# Patient Record
Sex: Female | Born: 1945 | Race: Black or African American | Hispanic: No | Smoking: Never smoker
Health system: Southern US, Community
[De-identification: ages and names within clinical notes are randomized; demographics above are authoritative.]

## PROBLEM LIST (undated history)

## (undated) DIAGNOSIS — E785 Hyperlipidemia, unspecified: Secondary | ICD-10-CM

## (undated) DIAGNOSIS — E119 Type 2 diabetes mellitus without complications: Secondary | ICD-10-CM

## (undated) DIAGNOSIS — N289 Disorder of kidney and ureter, unspecified: Secondary | ICD-10-CM

## (undated) DIAGNOSIS — I10 Essential (primary) hypertension: Secondary | ICD-10-CM

## (undated) HISTORY — PX: ABDOMINAL HYSTERECTOMY: SHX81

## (undated) HISTORY — DX: Hyperlipidemia, unspecified: E78.5

---

## 2000-05-18 HISTORY — PX: BREAST BIOPSY: SHX20

## 2018-11-08 ENCOUNTER — Other Ambulatory Visit: Payer: Self-pay | Admitting: Geriatric Medicine

## 2018-11-08 DIAGNOSIS — Z1231 Encounter for screening mammogram for malignant neoplasm of breast: Secondary | ICD-10-CM

## 2018-12-23 ENCOUNTER — Ambulatory Visit: Payer: Self-pay

## 2019-02-20 ENCOUNTER — Other Ambulatory Visit: Payer: Self-pay

## 2019-02-20 ENCOUNTER — Ambulatory Visit
Admission: RE | Admit: 2019-02-20 | Discharge: 2019-02-20 | Disposition: A | Discharge status: Home / Self Care | Payer: Medicare Other | Source: Ambulatory Visit | Attending: Geriatric Medicine | Admitting: Geriatric Medicine

## 2019-02-20 ENCOUNTER — Ambulatory Visit: Payer: Self-pay

## 2019-02-20 DIAGNOSIS — Z1231 Encounter for screening mammogram for malignant neoplasm of breast: Secondary | ICD-10-CM

## 2019-04-07 ENCOUNTER — Ambulatory Visit
Admission: RE | Admit: 2019-04-07 | Discharge: 2019-04-07 | Disposition: A | Discharge status: Home / Self Care | Payer: Medicare Other | Source: Ambulatory Visit | Attending: Geriatric Medicine | Admitting: Geriatric Medicine

## 2019-04-07 ENCOUNTER — Other Ambulatory Visit: Payer: Self-pay | Admitting: Geriatric Medicine

## 2019-04-07 DIAGNOSIS — R05 Cough: Secondary | ICD-10-CM

## 2019-04-07 DIAGNOSIS — R059 Cough, unspecified: Secondary | ICD-10-CM

## 2020-08-08 ENCOUNTER — Emergency Department (HOSPITAL_COMMUNITY): Payer: Medicare Other

## 2020-08-08 ENCOUNTER — Encounter (HOSPITAL_COMMUNITY): Payer: Self-pay | Admitting: Emergency Medicine

## 2020-08-08 ENCOUNTER — Emergency Department (HOSPITAL_COMMUNITY)
Admission: EM | Admit: 2020-08-08 | Discharge: 2020-08-09 | Disposition: A | Discharge status: Home / Self Care | Payer: Medicare Other | Attending: Emergency Medicine | Admitting: Emergency Medicine

## 2020-08-08 DIAGNOSIS — R109 Unspecified abdominal pain: Secondary | ICD-10-CM | POA: Insufficient documentation

## 2020-08-08 DIAGNOSIS — E119 Type 2 diabetes mellitus without complications: Secondary | ICD-10-CM | POA: Diagnosis not present

## 2020-08-08 DIAGNOSIS — R072 Precordial pain: Secondary | ICD-10-CM

## 2020-08-08 DIAGNOSIS — I10 Essential (primary) hypertension: Secondary | ICD-10-CM | POA: Diagnosis not present

## 2020-08-08 DIAGNOSIS — K219 Gastro-esophageal reflux disease without esophagitis: Secondary | ICD-10-CM | POA: Diagnosis not present

## 2020-08-08 DIAGNOSIS — H6123 Impacted cerumen, bilateral: Secondary | ICD-10-CM | POA: Insufficient documentation

## 2020-08-08 DIAGNOSIS — R059 Cough, unspecified: Secondary | ICD-10-CM | POA: Insufficient documentation

## 2020-08-08 HISTORY — DX: Disorder of kidney and ureter, unspecified: N28.9

## 2020-08-08 HISTORY — DX: Essential (primary) hypertension: I10

## 2020-08-08 HISTORY — DX: Type 2 diabetes mellitus without complications: E11.9

## 2020-08-08 LAB — CBC
HCT: 37.4 % (ref 36.0–46.0)
Hemoglobin: 12.6 g/dL (ref 12.0–15.0)
MCH: 27.1 pg (ref 26.0–34.0)
MCHC: 33.7 g/dL (ref 30.0–36.0)
MCV: 80.4 fL (ref 80.0–100.0)
Platelets: 206 10*3/uL (ref 150–400)
RBC: 4.65 MIL/uL (ref 3.87–5.11)
RDW: 15.5 % (ref 11.5–15.5)
WBC: 13.6 10*3/uL — ABNORMAL HIGH (ref 4.0–10.5)
nRBC: 0 % (ref 0.0–0.2)

## 2020-08-08 LAB — HEPATIC FUNCTION PANEL
ALT: 21 U/L (ref 0–44)
AST: 24 U/L (ref 15–41)
Albumin: 4.3 g/dL (ref 3.5–5.0)
Alkaline Phosphatase: 88 U/L (ref 38–126)
Bilirubin, Direct: <0.1 mg/dL (ref 0.0–0.2)
Total Bilirubin: 0.6 mg/dL (ref 0.3–1.2)
Total Protein: 7.9 g/dL (ref 6.5–8.1)

## 2020-08-08 LAB — TROPONIN I (HIGH SENSITIVITY)
Troponin I (High Sensitivity): 5 ng/L (ref <18)
Troponin I (High Sensitivity): 4 ng/L (ref <18)

## 2020-08-08 LAB — BASIC METABOLIC PANEL
Anion gap: 10 (ref 5–15)
BUN: 19 mg/dL (ref 8–23)
CO2: 23 mmol/L (ref 22–32)
Calcium: 10.3 mg/dL (ref 8.9–10.3)
Chloride: 105 mmol/L (ref 98–111)
Creatinine, Ser: 1.27 mg/dL — ABNORMAL HIGH (ref 0.44–1.00)
GFR, Estimated: 45 mL/min — ABNORMAL LOW (ref >60)
Glucose, Bld: 148 mg/dL — ABNORMAL HIGH (ref 70–99)
Potassium: 4.3 mmol/L (ref 3.5–5.1)
Sodium: 138 mmol/L (ref 135–145)

## 2020-08-08 MED ORDER — FAMOTIDINE 20 MG PO TABS
20.0000 mg | ORAL_TABLET | Freq: Once | ORAL | Status: AC
  Administered 2020-08-08: 20 mg via ORAL
  Filled 2020-08-08: qty 1

## 2020-08-08 MED ORDER — ALUM & MAG HYDROXIDE-SIMETH 200-200-20 MG/5ML PO SUSP
30.0000 mL | Freq: Once | ORAL | Status: AC
  Administered 2020-08-08: 30 mL via ORAL
  Filled 2020-08-08: qty 30

## 2020-08-08 MED ORDER — ACETAMINOPHEN 500 MG PO TABS
1000.0000 mg | ORAL_TABLET | Freq: Once | ORAL | Status: AC
  Administered 2020-08-08: 1000 mg via ORAL
  Filled 2020-08-08: qty 2

## 2020-08-08 NOTE — ED Provider Notes (Signed)
[facility] Provider Note   CSN: [id number] Arrival date & time: 08/08/20  1741     History Chief Complaint  Patient presents with  . Chest Pain    Lauren Spears is a 75 y.o. female.  Patient c/o mid chest discomfort at rest earlier today. Was at rest, states initially dull pain, midline, lower sternal area, non radiating, and then states noted feeling squishy, bubbly/gas, sensation in chest, felt warm, had non productive cough as if needed to clear throat. Denies any exertional chest pain or discomfort. No pleuritic chest pain. No vomiting. Denies diaphoresis. Denies sob or unusual doe. No fatigue. Also notes ear fullness in past few days, and occasional notices sense of fluid or swooshing in ears. No hearing loss or ringing. No drainage from ears. No severe ear pain or headache. States has tried to use peroxide/water to get wax out of ears. No vertigo or room spinning. No problems w balance or coordination, no falls. Denies hx cad. Denies fam hx premature cad. States a sister had unspecific heart problems.   The history is provided by the patient.  Chest Pain Associated symptoms: no abdominal pain, no back pain, no cough, no diaphoresis, no fever, no headache, no palpitations, no shortness of breath and no vomiting        Past Medical History:  Diagnosis Date  . Diabetes mellitus without complication ([place])   . Hypertension   . Renal disorder     There are no problems to display for this patient.   History reviewed. No pertinent surgical history.   OB History   No obstetric history on file.     History reviewed. No pertinent family history.     Home Medications Prior to Admission medications   Medication Sig Start Date End Date Taking? Authorizing Provider  amLODipine (NORVASC) 5 MG tablet Take 5 mg by mouth daily. 05/16/20  Yes [provider]  atorvastatin (LIPITOR) 40 MG tablet Take 40 mg by mouth daily. 05/16/20  Yes  [provider]    Allergies    Patient has no known allergies.  Review of Systems   Review of Systems  Constitutional: Negative for chills, diaphoresis and fever.  HENT: Negative for sore throat.   Eyes: Negative for pain and visual disturbance.  Respiratory: Negative for cough and shortness of breath.   Cardiovascular: Positive for chest pain. Negative for palpitations and leg swelling.  Gastrointestinal: Negative for abdominal pain and vomiting.  Genitourinary: Negative for flank pain.  Musculoskeletal: Negative for back pain and neck pain.  Skin: Negative for rash.  Neurological: Negative for headaches.  Hematological: Does not bruise/bleed easily.  Psychiatric/Behavioral: Negative for confusion.    Physical Exam Updated Vital Signs BP (!) 145/73   Pulse (!) 59   Temp 97.8 F (36.6 C)   Resp 12   SpO2 99%   Physical Exam Vitals and nursing note reviewed.  Constitutional:      Appearance: Normal appearance. She is well-developed.  HENT:     Head: Atraumatic.     Ears:     Comments: Partial cerumen impaction bilateral ears. No EO or OM noted. No mastoid tenderness.     Nose: Nose normal.     Mouth/Throat:     Mouth: Mucous membranes are moist.  Eyes:     General: No scleral icterus.    Conjunctiva/sclera: Conjunctivae normal.  Neck:     Trachea: No tracheal deviation.  Cardiovascular:     Rate and Rhythm:  Normal rate and regular rhythm.     Pulses: Normal pulses.     Heart sounds: Normal heart sounds. No murmur heard. No friction rub. No gallop.   Pulmonary:     Effort: Pulmonary effort is normal. No respiratory distress.     Breath sounds: Normal breath sounds.  Chest:     Chest wall: No tenderness.  Abdominal:     General: Bowel sounds are normal. There is no distension.     Palpations: Abdomen is soft.     Tenderness: There is no abdominal tenderness.  Genitourinary:    Comments: No cva tenderness.  Musculoskeletal:        General: No  swelling or tenderness.     Cervical back: Normal range of motion and neck supple. No rigidity. No muscular tenderness.     Right lower leg: No edema.     Left lower leg: No edema.  Skin:    General: Skin is warm and dry.     Findings: No rash.  Neurological:     Mental Status: She is alert.     Comments: Alert, speech normal. Motor/sens grossly intact bil. Steady gait.   Psychiatric:        Mood and Affect: Mood normal.     ED Results / Procedures / Treatments   Labs (all labs ordered are listed, but only abnormal results are displayed) Results for orders placed or performed during the hospital encounter of 13/08/65  Basic metabolic panel  Result Value Ref Range   Sodium 138 135 - 145 mmol/L   Potassium 4.3 3.5 - 5.1 mmol/L   Chloride 105 98 - 111 mmol/L   CO2 23 22 - 32 mmol/L   Glucose, Bld 148 (H) 70 - 99 mg/dL   BUN 19 8 - 23 mg/dL   Creatinine, Ser 1.27 (H) 0.44 - 1.00 mg/dL   Calcium 10.3 8.9 - 10.3 mg/dL   GFR, Estimated 45 (L) >60 mL/min   Anion gap 10 5 - 15  CBC  Result Value Ref Range   WBC 13.6 (H) 4.0 - 10.5 K/uL   RBC 4.65 3.87 - 5.11 MIL/uL   Hemoglobin 12.6 12.0 - 15.0 g/dL   HCT 37.4 36.0 - 46.0 %   MCV 80.4 80.0 - 100.0 fL   MCH 27.1 26.0 - 34.0 pg   MCHC 33.7 30.0 - 36.0 g/dL   RDW 15.5 11.5 - 15.5 %   Platelets 206 150 - 400 K/uL   nRBC 0.0 0.0 - 0.2 %  Hepatic function panel  Result Value Ref Range   Total Protein 7.9 6.5 - 8.1 g/dL   Albumin 4.3 3.5 - 5.0 g/dL   AST 24 15 - 41 U/L   ALT 21 0 - 44 U/L   Alkaline Phosphatase 88 38 - 126 U/L   Total Bilirubin 0.6 0.3 - 1.2 mg/dL   Bilirubin, Direct <0.1 0.0 - 0.2 mg/dL   Indirect Bilirubin NOT CALCULATED 0.3 - 0.9 mg/dL  Troponin I (High Sensitivity)  Result Value Ref Range   Troponin I (High Sensitivity) 5 <18 ng/L  Troponin I (High Sensitivity)  Result Value Ref Range   Troponin I (High Sensitivity) 4 <18 ng/L   DG Chest 2 View  Result Date: 08/08/2020 CLINICAL DATA:  Chest pain  EXAM: CHEST - 2 VIEW COMPARISON:  04/07/2019 FINDINGS: The heart size and mediastinal contours are within normal limits. Both lungs are clear. The visualized skeletal structures are unremarkable. IMPRESSION: No active cardiopulmonary disease. Electronically Signed  By: Rolm Baptise M.D.   On: 08/08/2020 18:30    EKG EKG Interpretation  Date/Time:  Thursday August 08 2020 17:49:54 EDT Ventricular Rate:  95 PR Interval:  154 QRS Duration: 112 QT Interval:  394 QTC Calculation: 495 R Axis:   -120 Text Interpretation: Normal sinus rhythm Low voltage QRS Right bundle branch block Non-specific ST-t changes No previous tracing Confirmed by Lajean Saver [phone]) on 08/08/2020 8:24:46 PM   Radiology DG Chest 2 View  Result Date: 08/08/2020 CLINICAL DATA:  Chest pain EXAM: CHEST - 2 VIEW COMPARISON:  04/07/2019 FINDINGS: The heart size and mediastinal contours are within normal limits. Both lungs are clear. The visualized skeletal structures are unremarkable. IMPRESSION: No active cardiopulmonary disease. Electronically Signed   By: Rolm Baptise M.D.   On: 08/08/2020 18:30    Procedures Procedures   Medications Ordered in ED Medications  alum & mag hydroxide-simeth (MAALOX/MYLANTA) 200-200-20 MG/5ML suspension 30 mL (30 mLs Oral Given 08/08/20 2208)  famotidine (PEPCID) tablet 20 mg (20 mg Oral Given 08/08/20 2206)  acetaminophen (TYLENOL) tablet 1,000 mg (1,000 mg Oral Given 08/08/20 2206)    ED Course  I have reviewed the triage vital signs and the nursing notes.  Pertinent labs & imaging results that were available during my care of the patient were reviewed by me and considered in my medical decision making (see chart for details).    MDM Rules/Calculators/A&P                         Iv ns. Continuous pulse ox and cardiac monitoring. Stat labs. Imaging. Ecg.   Reviewed nursing notes and prior charts for additional history.   CXR reviewed/interpreted by me - no pna.   Labs  reviewed/interpreted by me - initial trop normal.   Pepcid, maalox and acetaminophen for symptom relief.   Recheck, family/daughter now here, notes recent c/o right flank area pain. States went to urgent care and was told possible pna, and given abx rx. In ED, no cough, no fever, no pna on imaging, chest cta. Abd is soft and non tender. ?recent ruq/flank pain. No abd mass, no puls mass. No cva tenderness. No dysuria or hematuria. Denies hx gallstones. No hx kidney stones.  Will add additional labs and imaging.   2315, signed out to Dr Betsey Holiday that u/s and UA are pending - if ok, anticipate d/c to home with outpatient f/u.  Return precautions provided.    Final Clinical Impression(s) / ED Diagnoses Final diagnoses:  Precordial chest pain  Gastroesophageal reflux disease, unspecified whether esophagitis present  Bilateral impacted cerumen  Right flank pain    Rx / DC Orders ED Discharge Orders    None       Lajean Saver, MD 08/08/20 2316

## 2020-08-08 NOTE — ED Triage Notes (Signed)
Patient complains of chest pain that started earlier today and resolved, reports feeling like something gripped around her heart. Patient denies pain at this time. Patient alert, oriented, and in no apparent distress at this time.

## 2020-08-08 NOTE — Discharge Instructions (Addendum)
It was our pleasure to provide your ER care today - we hope that you feel better.  If gi/reflux symptoms, take pepcid, gas-x, or maalox as need.   Rest.  Drink plenty of fluids, stay well hydrated.   Follow up with primary care doctor in the coming week.   For recent chest pain, follow up with cardiologist in the next 1-2 weeks - call office to arrange appointment.   For ear symptoms, see attached information, if symptoms persist, follow up with ENT doctor in the next couple weeks - call office to arrange appointment.   Return to ER if worse, new symptoms, fevers, new or severe pain, trouble breathing, recurrent or persistent chest pain, or other concern.

## 2020-08-09 NOTE — ED Provider Notes (Signed)
Patient signed out to me by Dr. Ashok Cordia to follow-up on ultrasound.  Patient seen earlier with chest and right-sided abdominal discomfort.  No pain currently but has been experiencing intermittent "gurgling" on the right side of the abdomen.  Cardiac evaluation is negative.  Symptoms are atypical for cardiac etiology.  Ultrasound has been reviewed.  She does have evidence of hepatic steatosis but no evidence of gallbladder disease or acute process.  Patient will therefore be discharged, follow-up with PCP.   Orpah Greek, MD 08/09/20 [phone]

## 2021-05-01 ENCOUNTER — Other Ambulatory Visit: Payer: Self-pay | Admitting: Internal Medicine

## 2021-05-01 ENCOUNTER — Ambulatory Visit
Admission: RE | Admit: 2021-05-01 | Discharge: 2021-05-01 | Disposition: A | Discharge status: Home / Self Care | Payer: Medicare Other | Source: Ambulatory Visit | Attending: Internal Medicine | Admitting: Internal Medicine

## 2021-05-01 DIAGNOSIS — R0609 Other forms of dyspnea: Secondary | ICD-10-CM

## 2021-05-26 ENCOUNTER — Other Ambulatory Visit: Payer: Self-pay

## 2021-05-26 ENCOUNTER — Ambulatory Visit: Payer: Medicare Other | Admitting: Cardiology

## 2021-05-26 ENCOUNTER — Encounter: Payer: Self-pay | Admitting: Cardiology

## 2021-05-26 VITALS — BP 117/64 | HR 88 | Temp 97.6°F | Resp 16 | Ht 65.0 in | Wt 168.2 lb

## 2021-05-26 DIAGNOSIS — I4510 Unspecified right bundle-branch block: Secondary | ICD-10-CM

## 2021-05-26 DIAGNOSIS — N183 Chronic kidney disease, stage 3 unspecified: Secondary | ICD-10-CM

## 2021-05-26 DIAGNOSIS — R072 Precordial pain: Secondary | ICD-10-CM

## 2021-05-26 DIAGNOSIS — R0602 Shortness of breath: Secondary | ICD-10-CM

## 2021-05-26 DIAGNOSIS — E119 Type 2 diabetes mellitus without complications: Secondary | ICD-10-CM

## 2021-05-26 DIAGNOSIS — E782 Mixed hyperlipidemia: Secondary | ICD-10-CM

## 2021-05-26 DIAGNOSIS — I129 Hypertensive chronic kidney disease with stage 1 through stage 4 chronic kidney disease, or unspecified chronic kidney disease: Secondary | ICD-10-CM

## 2021-05-26 NOTE — Progress Notes (Signed)
Date:  05/26/2021   ID:  Lauren Spears, DOB [birth year], MRN [id number]  PCP:  Lauren Ferretti, MD  Cardiologist:  Lauren Kras, DO, [facility] (established care 05/26/2021)  REASON FOR CONSULT: Exertional dyspnea  REQUESTING PHYSICIAN:  Lauren Manes, MD [address] [place],  [place] [address]  Chief Complaint  Patient presents with   Shortness of Breath   New Patient (Initial Visit)   Chest Pain   Fatigue    HPI  Lauren Spears is a 76 y.o. African-American female who presents to the office with a chief complaint of " shortness of breath, chest pain, fatigue." Patient's past medical history and cardiovascular risk factors include: HTN, HLD, diet-controlled type 2 diabetes mellitus, postmenopausal female.  She is referred to the office at the request of Lauren Manes, MD for evaluation of exertional dyspnea.  Patient is accompanied by her daughter Lauren Spears at today's office visit.  Patient provides verbal consent in regards to discussing her medications/medical conditions in her presence.  She had a flu vaccination as well as COVID immunization in December 2022 and since then has been feeling tired, fatigued, chest discomfort and shortness of breath.  She just feels exhausted and therefore had gone to see her PCP.  She was noted to have an abnormal EKG and therefore referred to cardiology.  Chest pain: Has been present for the last several months, nonprogressive, last episode December 2022, located substernally, intensity 6 out of 10, describes it as a discomfort like sensation, lasting for few seconds, worse with effort related activities such as house chores at times, improves with resting.  No recent hospitalizations or urgent care visits for these symptoms.  Associated symptoms include exertional dyspnea.  However denies orthopnea, paroxysmal nocturnal dyspnea lower extremity swelling.  Patient does have sublingual nitroglycerin tablets but has not required it.  Younger  brother has a diagnosis of hypertrophic cardiomyopathy.  Patient denies any such diagnoses.  No prior history of syncope.  FUNCTIONAL STATUS: Does her daily house chores at the age of 65.  However no structured exercise program or daily routine.  ALLERGIES: No Known Allergies  MEDICATION LIST PRIOR TO VISIT: Current Meds  Medication Sig   amLODipine (NORVASC) 5 MG tablet Take 5 mg by mouth daily.   aspirin EC 81 MG tablet Take 81 mg by mouth daily. Swallow whole.   atorvastatin (LIPITOR) 40 MG tablet Take 40 mg by mouth daily.   cholecalciferol (VITAMIN D3) 25 MCG (1000 UNIT) tablet 1 tablet   nitroGLYCERIN (NITROSTAT) 0.4 MG SL tablet Place 0.4 mg under the tongue as directed.     PAST MEDICAL HISTORY: Past Medical History:  Diagnosis Date   Diabetes mellitus without complication ([place])    Hyperlipidemia    Hypertension    Renal disorder     PAST SURGICAL HISTORY: Past Surgical History:  Procedure Laterality Date   ABDOMINAL HYSTERECTOMY      FAMILY HISTORY: The patient family history includes Heart disease in her brother and sister.  SOCIAL HISTORY:  The patient  reports that she has never smoked. She has never used smokeless tobacco. She reports that she does not drink alcohol and does not use drugs.  REVIEW OF SYSTEMS: Review of Systems  Constitutional: Positive for malaise/fatigue. Negative for chills and fever.  HENT:  Negative for hoarse voice and nosebleeds.   Eyes:  Negative for discharge, double vision and pain.  Cardiovascular:  Positive for chest pain and dyspnea on exertion. Negative for claudication, leg swelling, near-syncope,  orthopnea, palpitations, paroxysmal nocturnal dyspnea and syncope.  Respiratory:  Positive for shortness of breath. Negative for hemoptysis.   Musculoskeletal:  Negative for muscle cramps and myalgias.  Gastrointestinal:  Negative for abdominal pain, constipation, diarrhea, hematemesis, hematochezia, melena, nausea and vomiting.   Neurological:  Negative for dizziness and light-headedness.   PHYSICAL EXAM: Vitals with BMI 05/26/2021 08/09/2020 08/09/2020  Height 5\' 5"  - -  Weight 168 lbs 3 oz - -  BMI 16.10 - -  Systolic 960 - 454  Diastolic 64 - 74  Pulse 88 69 58    CONSTITUTIONAL: Well-developed and well-nourished. No acute distress.  SKIN: Skin is warm and dry. No rash noted. No cyanosis. No pallor. No jaundice HEAD: Normocephalic and atraumatic.  EYES: No scleral icterus MOUTH/THROAT: Moist oral membranes.  NECK: No JVD present. No thyromegaly noted. No carotid bruits  LYMPHATIC: No visible cervical adenopathy.  CHEST Normal respiratory effort. No intercostal retractions  LUNGS: Clear to auscultation bilaterally.  No stridor. No wheezes. No rales.  CARDIOVASCULAR: Regular rate and rhythm, positive S1-S2, no murmurs rubs or gallops appreciated. ABDOMINAL: Soft, nontender, nondistended, positive bowel sounds in all 4 quadrants, no apparent ascites.  EXTREMITIES: No peripheral edema, warm to touch, 2+ DP bilateral pulses HEMATOLOGIC: No significant bruising NEUROLOGIC: Oriented to person, place, and time. Nonfocal. Normal muscle tone.  PSYCHIATRIC: Normal mood and affect. Normal behavior. Cooperative  CARDIAC DATABASE: EKG: 05/26/2021: Normal sinus rhythm, 80 bpm, right bundle branch block, right axis, poor R wave progression.   Echocardiogram: No results found for this or any previous visit from the past 1095 days.    Stress Testing: No results found for this or any previous visit from the past 1095 days.   Heart Catheterization: None  LABORATORY DATA: CBC Latest Ref Rng & Units 08/08/2020  WBC 4.0 - 10.5 K/uL 13.6(H)  Hemoglobin 12.0 - 15.0 g/dL 12.6  Hematocrit 36.0 - 46.0 % 37.4  Platelets 150 - 400 K/uL 206    CMP Latest Ref Rng & Units 08/08/2020  Glucose 70 - 99 mg/dL 148(H)  BUN 8 - 23 mg/dL 19  Creatinine 0.44 - 1.00 mg/dL 1.27(H)  Sodium 135 - 145 mmol/L 138  Potassium 3.5 - 5.1  mmol/L 4.3  Chloride 98 - 111 mmol/L 105  CO2 22 - 32 mmol/L 23  Calcium 8.9 - 10.3 mg/dL 10.3  Total Protein 6.5 - 8.1 g/dL 7.9  Total Bilirubin 0.3 - 1.2 mg/dL 0.6  Alkaline Phos 38 - 126 U/L 88  AST 15 - 41 U/L 24  ALT 0 - 44 U/L 21    Lipid Panel  No results found for: CHOL, TRIG, HDL, CHOLHDL, VLDL, LDLCALC, LDLDIRECT, LABVLDL  No components found for: NTPROBNP No results for input(s): PROBNP in the last 8760 hours. No results for input(s): TSH in the last 8760 hours.  BMP Recent Labs    08/08/20 1751  NA 138  K 4.3  CL 105  CO2 23  GLUCOSE 148*  BUN 19  CREATININE 1.27*  CALCIUM 10.3  GFRNONAA 45*    HEMOGLOBIN A1C No results found for: HGBA1C, MPG  External Labs: Available in Care Everywhere 05/01/2021  Name Result Date Reference Range  CBC without Diff   2021-05-01    WBC 6.9   4.0-11.0  RBC 4.72   4.20-5.40  HGB 12.3   12.0-16.0  HCT 37.6   37.0-47.0  MCV 79.7   81.0-99.0  MCH 26.1   27.0-33.0  MCHC 32.8   32.0-36.0  RDW 14.8  11.5-15.5  PLT 221   150-400  Culture if Positive (Urinalysis)   2021-05-01    CIFP See Culture Report from [facility]      Comp Metabolic Panel   2706-23-76    Glucose 105   70-99  BUN 19   6-26  Creatinine 1.38   0.60-1.30  eGFR2021 40   >60  Sodium 140   136-145  Potassium 4.1   3.5-5.5  Chloride 105   98-107  CO2 26   22-32  Anion Gap 12.9   6.0-20.0  Calcium 10.4   8.6-10.3  CA-corrected 9.95   8.60-10.30  Protein, Total 7.1   6.0-8.3  Albumin 4.4   3.4-4.8  TBIL 0.9   0.3-1.0  ALP 94   38-126  AST 16   0-39  ALT 11   0-52   Lipid Panel w/reflex   2020-05-31    Cholesterol 146   <200  CHOL/HDL 3.0   2.0-4.0  HDLD 48   30-85  Triglyceride 92   0-199  NHDL 98   0-129  LDL Chol Calc (NIH) 81   0-99  LDL Chol Calc (NIH) 81   0-99   IMPRESSION:    ICD-10-CM   1. Shortness of breath  R06.02 EKG 12-Lead    PCV ECHOCARDIOGRAM COMPLETE    PCV MYOCARDIAL PERFUSION WO LEXISCAN    2. Precordial pain  R07.2  PCV ECHOCARDIOGRAM COMPLETE    PCV MYOCARDIAL PERFUSION WO LEXISCAN    CT CARDIAC SCORING ([facility] LOCATIONS ONLY)    3. Right bundle branch block  I45.10 PCV MYOCARDIAL PERFUSION WO LEXISCAN    4. Mixed hyperlipidemia  E78.2     5. Benign hypertension with CKD (chronic kidney disease) stage III (H[facility])  I12.9    N18.30     6. Diet-controlled diabetes mellitus ([place])  E11.9        RECOMMENDATIONS: Kaysa Roulhac is a 76 y.o. African-American female whose past medical history and cardiac risk factors include: HTN, HLD, diet-controlled type 2 diabetes mellitus, postmenopausal female.  Shortness of breath/precordial pain: Patient's chest pain appears to be having both cardiac and noncardiac features. Last episode December 2022 No recent hospitalizations or urgent care visits for cardiovascular symptoms/evaluation Patient has been prescribed sublingual nitroglycerin tablet by PCP and has not required the use of it. EKG: Sinus rhythm without underlying injury pattern Given her symptoms, risk factors, and underlying right bundle branch block recommend exercise nuclear stress test to evaluate for functional status, reversible ischemia. Echocardiogram will be ordered to evaluate for structural heart disease and left ventricular systolic function. Outside labs independently reviewed as part of today's encounter from care everywhere noted above for further reference. Educated on seeking medical attention sooner by going to the closest ER via EMS if the symptoms increase in intensity, frequency, duration, or has typical chest pain as discussed in the office.  Patient verbalized understanding.  Right bundle branch block Monitor for now.  Mixed hyperlipidemia Currently on Lipitor.   She denies myalgia or other side effects. Most recent lipids dated 05/31/2020 reviewed as noted above. Currently managed by primary care provider.   Benign hypertension with CKD (chronic kidney disease) stage III  ([place]) Office blood pressure is at goal.  Medication reconciled.  Low salt diet recommended. A diet that is rich in fruits, vegetables, legumes, and low-fat dairy products and low in snacks, sweets, and meats (such as the Dietary Approaches to Stop Hypertension [DASH] diet).   Currently managed by primary care provider.  Diet-controlled diabetes  mellitus ([place]) Currently on statin therapy. Most recent hemoglobin A1c well controlled. May consider transitioning her from amlodipine to either ACE inhibitor/ARB given her history of diabetes.  We will defer to PCP  FINAL MEDICATION LIST END OF ENCOUNTER: No orders of the defined types were placed in this encounter.   There are no discontinued medications.   Current Outpatient Medications:    amLODipine (NORVASC) 5 MG tablet, Take 5 mg by mouth daily., Disp: , Rfl:    aspirin EC 81 MG tablet, Take 81 mg by mouth daily. Swallow whole., Disp: , Rfl:    atorvastatin (LIPITOR) 40 MG tablet, Take 40 mg by mouth daily., Disp: , Rfl:    cholecalciferol (VITAMIN D3) 25 MCG (1000 UNIT) tablet, 1 tablet, Disp: , Rfl:    nitroGLYCERIN (NITROSTAT) 0.4 MG SL tablet, Place 0.4 mg under the tongue as directed., Disp: , Rfl:   Orders Placed This Encounter  Procedures   CT CARDIAC SCORING ([facility] LOCATIONS ONLY)   PCV MYOCARDIAL PERFUSION WO LEXISCAN   EKG 12-Lead   PCV ECHOCARDIOGRAM COMPLETE    There are no Patient Instructions on file for this visit.   --Continue cardiac medications as reconciled in final medication list. --Return in about 4 weeks (around 06/23/2021) for Follow up, Chest pain. Or sooner if needed. --Continue follow-up with your primary care physician regarding the management of your other chronic comorbid conditions.  Patient's questions and concerns were addressed to her satisfaction. She voices understanding of the instructions provided during this encounter.   This note was created using a voice recognition software as a result there  may be grammatical errors inadvertently enclosed that do not reflect the nature of this encounter. Every attempt is made to correct such errors.  Lauren Spears, [place]  Pager: [phone] Office: [phone]

## 2021-06-25 ENCOUNTER — Ambulatory Visit: Payer: Medicare Other

## 2021-06-25 ENCOUNTER — Other Ambulatory Visit: Payer: Self-pay

## 2021-06-25 DIAGNOSIS — R072 Precordial pain: Secondary | ICD-10-CM

## 2021-06-25 DIAGNOSIS — R0602 Shortness of breath: Secondary | ICD-10-CM

## 2021-06-25 DIAGNOSIS — I4510 Unspecified right bundle-branch block: Secondary | ICD-10-CM

## 2021-07-02 ENCOUNTER — Ambulatory Visit: Payer: Medicare Other | Admitting: Cardiology

## 2021-07-02 ENCOUNTER — Encounter: Payer: Self-pay | Admitting: Cardiology

## 2021-07-02 ENCOUNTER — Other Ambulatory Visit: Payer: Self-pay

## 2021-07-02 VITALS — BP 116/72 | HR 76 | Temp 97.3°F | Resp 17 | Ht 65.0 in | Wt 165.2 lb

## 2021-07-02 DIAGNOSIS — R072 Precordial pain: Secondary | ICD-10-CM

## 2021-07-02 DIAGNOSIS — E782 Mixed hyperlipidemia: Secondary | ICD-10-CM

## 2021-07-02 DIAGNOSIS — E119 Type 2 diabetes mellitus without complications: Secondary | ICD-10-CM

## 2021-07-02 DIAGNOSIS — R0602 Shortness of breath: Secondary | ICD-10-CM

## 2021-07-02 DIAGNOSIS — I4510 Unspecified right bundle-branch block: Secondary | ICD-10-CM

## 2021-07-02 DIAGNOSIS — N183 Chronic kidney disease, stage 3 unspecified: Secondary | ICD-10-CM

## 2021-07-02 DIAGNOSIS — I129 Hypertensive chronic kidney disease with stage 1 through stage 4 chronic kidney disease, or unspecified chronic kidney disease: Secondary | ICD-10-CM

## 2021-07-02 NOTE — Progress Notes (Signed)
Date:  07/02/2021   ID:  Lauren Spears, DOB 1945-05-25, MRN [id number]  PCP:  Lajean Manes, MD  Cardiologist:  Rex Kras, DO, [facility] (established care 05/26/2021)  Date: 07/02/21 Last Office Visit: 05/26/2021  Chief Complaint  Patient presents with   Shortness of Breath   Follow-up    4 WEEKS   Chest Pain    HPI  Lauren Spears is a 76 y.o. African-American female who presents to the office with a chief complaint of " reevaluation of chest pain, shortness of breath, and discuss test results." Patient's past medical history and cardiovascular risk factors include: HTN, HLD, diet-controlled type 2 diabetes mellitus, postmenopausal female.  She is referred to the office at the request of Charlane Ferretti, MD for evaluation of exertional dyspnea.  Patient is accompanied by her daughter Sharyn Lull at today's office visit.  Patient provides verbal consent in regards to discussing her medications/medical conditions in her presence.  December 2022 she had her flu and COVID-19 immunizations : Has been experiencing generalized tired/fatigue, chest discomfort and shortness of breath.  She was evaluated by PCP and later referred to cardiology for further evaluation and management.  Her precordial discomfort appear to be having both cardiac and noncardiac features and given her age and risk factors she was recommended to undergo echo and stress test.  Echocardiogram notes preserved LVEF, normal diastolic function, no significant valvular heart disease.  An exercise nuclear stress test notes no significant reversible ischemia with a small size fixed perfusion defect likely due to soft tissue attenuation artifact.  Since last office visit patient states that her chest pain and shortness of breath have improved but not resolved.  Patient states that the shortness of breath that leads to precordial discomfort, located substernally, occurs randomly, lasting less than 5 minutes, nonradiating, nonexertional, and not  improving with resting, self-limited.  She has not required sublingual nitroglycerin tablets.  Younger brother has a diagnosis of hypertrophic cardiomyopathy.  Patient denies any such diagnoses.  No prior history of syncope.  FUNCTIONAL STATUS: Does her daily house chores at the age of 76.  However no structured exercise program or daily routine.  ALLERGIES: No Known Allergies  MEDICATION LIST PRIOR TO VISIT: Current Meds  Medication Sig   amLODipine (NORVASC) 5 MG tablet Take 5 mg by mouth daily.   aspirin EC 81 MG tablet Take 81 mg by mouth daily. Swallow whole.   atorvastatin (LIPITOR) 40 MG tablet Take 40 mg by mouth daily.   cholecalciferol (VITAMIN D3) 25 MCG (1000 UNIT) tablet 1 tablet   nitroGLYCERIN (NITROSTAT) 0.4 MG SL tablet Place 0.4 mg under the tongue as directed.     PAST MEDICAL HISTORY: Past Medical History:  Diagnosis Date   Diabetes mellitus without complication ([place])    Hyperlipidemia    Hypertension    Renal disorder     PAST SURGICAL HISTORY: Past Surgical History:  Procedure Laterality Date   ABDOMINAL HYSTERECTOMY      FAMILY HISTORY: The patient family history includes Cancer (age of onset: 64) in her sister; Heart disease in her brother and sister; Leukemia (age of onset: 63) in her mother; Prostate cancer (age of onset: 38) in her brother and brother; Sickle cell anemia (age of onset: 77) in her father.  SOCIAL HISTORY:  The patient  reports that she has never smoked. She has never used smokeless tobacco. She reports that she does not drink alcohol and does not use drugs.  REVIEW OF SYSTEMS: Review of Systems  HENT:  Negative for hoarse voice and nosebleeds.   Eyes:  Negative for discharge, double vision and pain.  Cardiovascular:  Positive for chest pain (improved) and dyspnea on exertion (improved). Negative for claudication, leg swelling, near-syncope, orthopnea, palpitations, paroxysmal nocturnal dyspnea and syncope.  Respiratory:   Positive for shortness of breath (improved). Negative for hemoptysis.   Musculoskeletal:  Negative for muscle cramps and myalgias.  Gastrointestinal:  Negative for abdominal pain, constipation, diarrhea, hematemesis, hematochezia, melena, nausea and vomiting.  Neurological:  Negative for dizziness and light-headedness.   PHYSICAL EXAM: Vitals with BMI 07/02/2021 05/26/2021 08/09/2020  Height 5\' 5"  5\' 5"  -  Weight 165 lbs 3 oz 168 lbs 3 oz -  BMI 75.64 33.29 -  Systolic 518 841 -  Diastolic 72 64 -  Pulse 76 88 69    CONSTITUTIONAL: Well-developed and well-nourished. No acute distress.  SKIN: Skin is warm and dry. No rash noted. No cyanosis. No pallor. No jaundice HEAD: Normocephalic and atraumatic.  EYES: No scleral icterus MOUTH/THROAT: Moist oral membranes.  NECK: No JVD present. No thyromegaly noted. No carotid bruits  LYMPHATIC: No visible cervical adenopathy.  CHEST Normal respiratory effort. No intercostal retractions  LUNGS: Clear to auscultation bilaterally.  No stridor. No wheezes. No rales.  CARDIOVASCULAR: Regular rate and rhythm, positive S1-S2, no murmurs rubs or gallops appreciated. ABDOMINAL: Soft, nontender, nondistended, positive bowel sounds in all 4 quadrants, no apparent ascites.  EXTREMITIES: No peripheral edema, warm to touch, 2+ DP bilateral pulses HEMATOLOGIC: No significant bruising NEUROLOGIC: Oriented to person, place, and time. Nonfocal. Normal muscle tone.  PSYCHIATRIC: Normal mood and affect. Normal behavior. Cooperative  CARDIAC DATABASE: EKG: 05/26/2021: Normal sinus rhythm, 80 bpm, right bundle branch block, right axis, poor R wave progression.   Echocardiogram: 06/25/2021: Left ventricle cavity is normal in size. Moderate septal asymmetric hypertrophy of the left ventricle. Septum measures 1.4 cm. Normal LV systolic function with EF 64%. Normal global wall motion. Normal diastolic filling pattern.  Trileaflet aortic valve with mild aortic valve leaflet  thickening. No evidence of aortic valve stenosis or regurgitation. Normal right atrial pressure.    Stress Testing: Exercise Myoview stress test 06/25/2020: Exercise nuclear stress test was performed using Bruce protocol.  1 Day Rest/Stress Protocol. Exercise time 3 minutes 29 seconds on Bruce protocol, achieved 5.22 METS, 92% of APMHR .  Stress ECG non-diagnostic for ischemia due to baseline ST-T changes due to RBBB. Small size, mild intensity, fixed perfusion defect involving the basal inferior inferolateral segments likely due to soft tissue attenuation artifact. LVEF is preserved (65%), normal wall thickness, no high-grade regional wall motion modalities. Low risk study.  Heart Catheterization: None  LABORATORY DATA: CBC Latest Ref Rng & Units 08/08/2020  WBC 4.0 - 10.5 K/uL 13.6(H)  Hemoglobin 12.0 - 15.0 g/dL 12.6  Hematocrit 36.0 - 46.0 % 37.4  Platelets 150 - 400 K/uL 206    CMP Latest Ref Rng & Units 08/08/2020  Glucose 70 - 99 mg/dL 148(H)  BUN 8 - 23 mg/dL 19  Creatinine 0.44 - 1.00 mg/dL 1.27(H)  Sodium 135 - 145 mmol/L 138  Potassium 3.5 - 5.1 mmol/L 4.3  Chloride 98 - 111 mmol/L 105  CO2 22 - 32 mmol/L 23  Calcium 8.9 - 10.3 mg/dL 10.3  Total Protein 6.5 - 8.1 g/dL 7.9  Total Bilirubin 0.3 - 1.2 mg/dL 0.6  Alkaline Phos 38 - 126 U/L 88  AST 15 - 41 U/L 24  ALT 0 - 44 U/L 21  Lipid Panel  No results found for: CHOL, TRIG, HDL, CHOLHDL, VLDL, LDLCALC, LDLDIRECT, LABVLDL  No components found for: NTPROBNP No results for input(s): PROBNP in the last 8760 hours. No results for input(s): TSH in the last 8760 hours.  BMP Recent Labs    08/08/20 1751  NA 138  K 4.3  CL 105  CO2 23  GLUCOSE 148*  BUN 19  CREATININE 1.27*  CALCIUM 10.3  GFRNONAA 45*    HEMOGLOBIN A1C No results found for: HGBA1C, MPG  External Labs: Available in Care Everywhere 05/01/2021  Name Result Date Reference Range  CBC without Diff   2021-05-01    WBC 6.9   4.0-11.0   RBC 4.72   4.20-5.40  HGB 12.3   12.0-16.0  HCT 37.6   37.0-47.0  MCV 79.7   81.0-99.0  MCH 26.1   27.0-33.0  MCHC 32.8   32.0-36.0  RDW 14.8   11.5-15.5  PLT 221   150-400  Culture if Positive (Urinalysis)   2021-05-01    CIFP See Culture Report from [place]      Comp Metabolic Panel   1194-17-40    Glucose 105   70-99  BUN 19   6-26  Creatinine 1.38   0.60-1.30  eGFR2021 40   >60  Sodium 140   136-145  Potassium 4.1   3.5-5.5  Chloride 105   98-107  CO2 26   22-32  Anion Gap 12.9   6.0-20.0  Calcium 10.4   8.6-10.3  CA-corrected 9.95   8.60-10.30  Protein, Total 7.1   6.0-8.3  Albumin 4.4   3.4-4.8  TBIL 0.9   0.3-1.0  ALP 94   38-126  AST 16   0-39  ALT 11   0-52   Lipid Panel w/reflex   2020-05-31    Cholesterol 146   <200  CHOL/HDL 3.0   2.0-4.0  HDLD 48   30-85  Triglyceride 92   0-199  NHDL 98   0-129  LDL Chol Calc (NIH) 81   0-99  LDL Chol Calc (NIH) 81   0-99   IMPRESSION:    ICD-10-CM   1. Shortness of breath  R06.02     2. Precordial pain  R07.2     3. Right bundle branch block  I45.10     4. Benign hypertension with CKD (chronic kidney disease) stage III (HCC)  I12.9    N18.30     5. Mixed hyperlipidemia  E78.2     6. Diet-controlled diabetes mellitus ([place])  E11.9        RECOMMENDATIONS: Tailer Volkert is a 76 y.o. African-American female whose past medical history and cardiac risk factors include: HTN, HLD, diet-controlled type 2 diabetes mellitus, postmenopausal female.  Shortness of breath  / Precordial pain Improving, not resolved. Precordial discomfort appears to be noncardiac. No use of sublingual nitroglycerin tablet since last office encounter. EKG: Overall sinus without underlying injury pattern. Echo: Preserved LVEF, normal diastolic function, no significant valvular heart disease, moderate septal asymmetrical hypertrophy 1.4 cm. Exercise nuclear stress test: No reversible myocardial ischemia prior small sized, mild intensity,  fixed perfusion defect likely due to soft tissue attenuation artifact. Blood pressure is well controlled. LDL is within acceptable range, goal LDL less than 70 mg/dL. For now no additional cardiovascular testing warranted at this time besides completing a coronary calcium score that was ordered at the last visit.  In the meantime would recommend pulmonary evaluation to see if her shortness of breath could be pulmonary  related.  Clinically euvolemic and not in congestive heart failure.  Benign hypertension with CKD (chronic kidney disease) stage III ([place]) Office blood pressures are well controlled.  Medications reconciled.  Given her diabetes would recommend either ACE inhibitor's or ARB -will defer to PCP Currently managed by primary care provider.  Mixed hyperlipidemia Currently on atorvastatin, denies myalgias. Last lipid profile from January 2022 independently reviewed and noted above for further reference. Currently managed by primary care provider.  Diet-controlled diabetes mellitus ([place]) Most recent hemoglobin A1c well controlled. Continue statin therapy. If no contraindication would recommend discontinuation of amlodipine and considering ACE/ARB.  Total time spent: 30 minutes.    FINAL MEDICATION LIST END OF ENCOUNTER: No orders of the defined types were placed in this encounter.   There are no discontinued medications.   Current Outpatient Medications:    amLODipine (NORVASC) 5 MG tablet, Take 5 mg by mouth daily., Disp: , Rfl:    aspirin EC 81 MG tablet, Take 81 mg by mouth daily. Swallow whole., Disp: , Rfl:    atorvastatin (LIPITOR) 40 MG tablet, Take 40 mg by mouth daily., Disp: , Rfl:    cholecalciferol (VITAMIN D3) 25 MCG (1000 UNIT) tablet, 1 tablet, Disp: , Rfl:    nitroGLYCERIN (NITROSTAT) 0.4 MG SL tablet, Place 0.4 mg under the tongue as directed., Disp: , Rfl:   No orders of the defined types were placed in this encounter.   There are no Patient Instructions  on file for this visit.   --Continue cardiac medications as reconciled in final medication list. --Return in about 3 months (around 09/29/2021) for Follow up, Dyspnea, Chest pain. Or sooner if needed. --Continue follow-up with your primary care physician regarding the management of your other chronic comorbid conditions.  Patient's questions and concerns were addressed to her satisfaction. She voices understanding of the instructions provided during this encounter.   This note was created using a voice recognition software as a result there may be grammatical errors inadvertently enclosed that do not reflect the nature of this encounter. Every attempt is made to correct such errors.  Rex Kras, [place], [facility]  Pager: [phone] Office: [phone]

## 2021-08-13 ENCOUNTER — Telehealth: Payer: Self-pay

## 2021-08-13 ENCOUNTER — Ambulatory Visit
Admission: RE | Admit: 2021-08-13 | Discharge: 2021-08-13 | Disposition: A | Discharge status: Home / Self Care | Payer: No Typology Code available for payment source | Source: Ambulatory Visit | Attending: Cardiology | Admitting: Cardiology

## 2021-08-13 DIAGNOSIS — R072 Precordial pain: Secondary | ICD-10-CM

## 2021-08-19 NOTE — Telephone Encounter (Signed)
Spoke to the patient regarding the findings on 08/15/2021.  ?I have asked her to review the pulmonary findings with her PCP.  ? ?Lauren Spears, Please send a copy the Calcium report to the patient's PCP for reference.  ? ?Rex Kras, DO, FACC ?

## 2021-08-20 NOTE — Telephone Encounter (Signed)
Ok I have sent a copy to pt pcp

## 2021-09-10 ENCOUNTER — Other Ambulatory Visit: Payer: Self-pay | Admitting: Geriatric Medicine

## 2021-09-10 DIAGNOSIS — Z1231 Encounter for screening mammogram for malignant neoplasm of breast: Secondary | ICD-10-CM

## 2021-09-24 ENCOUNTER — Ambulatory Visit: Payer: Medicare Other

## 2021-09-30 ENCOUNTER — Ambulatory Visit
Admission: RE | Admit: 2021-09-30 | Discharge: 2021-09-30 | Disposition: A | Discharge status: Home / Self Care | Payer: Medicare Other | Source: Ambulatory Visit | Attending: Geriatric Medicine | Admitting: Geriatric Medicine

## 2021-09-30 DIAGNOSIS — Z1231 Encounter for screening mammogram for malignant neoplasm of breast: Secondary | ICD-10-CM | POA: Diagnosis present

## 2021-10-01 ENCOUNTER — Ambulatory Visit: Payer: Medicare Other | Admitting: Cardiology

## 2021-10-21 ENCOUNTER — Encounter: Payer: Medicare Other | Admitting: Internal Medicine

## 2021-10-21 ENCOUNTER — Other Ambulatory Visit: Payer: Medicare Other

## 2021-10-22 ENCOUNTER — Inpatient Hospital Stay: Payer: Medicare Other | Attending: Internal Medicine | Admitting: Internal Medicine

## 2021-10-22 ENCOUNTER — Encounter: Payer: Self-pay | Admitting: Internal Medicine

## 2021-10-22 ENCOUNTER — Inpatient Hospital Stay: Payer: Medicare Other

## 2021-10-22 DIAGNOSIS — Z79899 Other long term (current) drug therapy: Secondary | ICD-10-CM | POA: Diagnosis not present

## 2021-10-22 DIAGNOSIS — D631 Anemia in chronic kidney disease: Secondary | ICD-10-CM | POA: Insufficient documentation

## 2021-10-22 DIAGNOSIS — N183 Chronic kidney disease, stage 3 unspecified: Secondary | ICD-10-CM | POA: Diagnosis not present

## 2021-10-22 DIAGNOSIS — E119 Type 2 diabetes mellitus without complications: Secondary | ICD-10-CM | POA: Insufficient documentation

## 2021-10-22 DIAGNOSIS — D472 Monoclonal gammopathy: Secondary | ICD-10-CM | POA: Diagnosis present

## 2021-10-22 DIAGNOSIS — Z7982 Long term (current) use of aspirin: Secondary | ICD-10-CM | POA: Diagnosis not present

## 2021-10-22 NOTE — Assessment & Plan Note (Addendum)
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  Otherwise mild anemia; chronic kidney disease stage III; noted to have NO  hypercalcemia.  No evidence of any active multiple myeloma.  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  Iron saturation 23%.  Continue monitoring for now not on oral iron.  #Chronic kidney disease-stage III-followed by PCP.  Thank you Dr.Raju for allowing me to participate in the care of your pleasant patient. Please do not hesitate to contact me with questions or concerns in the interim.   * will call pt re: results  # DISPOSITION: # sign release to get records from  PCP office # no labs today # # Follow up to in mid- DEC 2023- 2 weeks prior- MM panel; k/l light chain;cbc/cmp;iron studies/ferritin- Dr.B

## 2021-10-22 NOTE — Progress Notes (Signed)
Can you use her labs from a month ago instead of getting new labs?

## 2021-10-22 NOTE — Progress Notes (Signed)
[facility] CONSULT NOTE  Patient Care Team: Lajean Manes, MD as PCP - General (Internal Medicine)  CHIEF COMPLAINTS/PURPOSE OF CONSULTATION: Monoclonal gammopathy  HEMATOLOGY HISTORY  # MONOCLONAL GAMMOPATHY- MGUS [[place]-2018-2019; Bone marrow-Bx-No reports available]  # CKD stage- III [PCP]; PN; DM  HISTORY OF PRESENTING ILLNESS: Alone.  Ambulating independently. Lauren Spears 76 y.o.  female has been referred to [place] by PCP for further evaluation/work-up for monoclonal gammopathy.  Patient states that she was diagnosed with monoclonal gammopathy during part of work-up of chronic kidney disease in [place] about 5 years ago.  Patient stated she also had a bone marrow biopsy as part of work-up.  No unusual bone pain.  Chronic mild fatigue.  Tingling numbness in extremities-attributable to her diabetes.  Family history of multiple myeloma-Sister  Review of Systems  Constitutional:  Positive for malaise/fatigue. Negative for chills, diaphoresis, fever and weight loss.  HENT:  Negative for nosebleeds and sore throat.   Eyes:  Negative for double vision.  Respiratory:  Negative for cough, hemoptysis, sputum production, shortness of breath and wheezing.   Cardiovascular:  Negative for chest pain, palpitations, orthopnea and leg swelling.  Gastrointestinal:  Negative for abdominal pain, blood in stool, constipation, diarrhea, heartburn, melena, nausea and vomiting.  Genitourinary:  Negative for dysuria, frequency and urgency.  Musculoskeletal:  Positive for back pain and joint pain.  Skin: Negative.  Negative for itching and rash.  Neurological:  Positive for tingling. Negative for dizziness, focal weakness, weakness and headaches.  Endo/Heme/Allergies:  Does not bruise/bleed easily.  Psychiatric/Behavioral:  Negative for depression. The patient is not nervous/anxious and does not have insomnia.   All other systems reviewed and are negative.  MEDICAL HISTORY:   Past Medical History:  Diagnosis Date   Diabetes mellitus without complication ([place])    Hyperlipidemia    Hypertension    Renal disorder     SURGICAL HISTORY: Past Surgical History:  Procedure Laterality Date   ABDOMINAL HYSTERECTOMY     BREAST BIOPSY Right 2002   neg    SOCIAL HISTORY: Social History   Socioeconomic History   Marital status: Widowed    Spouse name: Not on file   Number of children: 1   Years of education: Not on file   Highest education level: Not on file  Occupational History   Not on file  Tobacco Use   Smoking status: Never   Smokeless tobacco: Never  Vaping Use   Vaping Use: Never used  Substance and Sexual Activity   Alcohol use: Never   Drug use: Never   Sexual activity: Not on file  Other Topics Concern   Not on file  Social History Narrative   Moved from [place] in 2019; lives in [place] with daughter. Never smoked; no alcohol. Used to retd clerk for telephone.    Social Determinants of Health   Financial Resource Strain: Not on file  Food Insecurity: Not on file  Transportation Needs: Not on file  Physical Activity: Not on file  Stress: Not on file  Social Connections: Not on file  Intimate Partner Violence: Not on file    FAMILY HISTORY: Family History  Problem Relation Age of Onset   Leukemia Mother 91   Sickle cell anemia Father 93   Heart disease Sister    Multiple myeloma Sister 54   Heart disease Brother    Prostate cancer Brother 71   Prostate cancer Brother 68    ALLERGIES:  has No Known Allergies.  MEDICATIONS:  Current Outpatient Medications  Medication Sig Dispense Refill   amLODipine (NORVASC) 5 MG tablet Take 5 mg by mouth daily.     atorvastatin (LIPITOR) 40 MG tablet Take 40 mg by mouth daily.     cholecalciferol (VITAMIN D3) 25 MCG (1000 UNIT) tablet 1,000 Units daily.     nitroGLYCERIN (NITROSTAT) 0.4 MG SL tablet Place 0.4 mg under the tongue as directed.     aspirin EC 81 MG tablet Take 81 mg by mouth  daily. Swallow whole. (Patient not taking: Reported on 10/22/2021)     No current facility-administered medications for this visit.      PHYSICAL EXAMINATION:   Vitals:   10/22/21 1118  BP: 129/86  Pulse: 71  Temp: (!) 97 F (36.1 C)  SpO2: 100%   Filed Weights   10/22/21 1118  Weight: 164 lb 12.8 oz (74.8 kg)    Physical Exam Vitals and nursing note reviewed.  HENT:     Head: Normocephalic and atraumatic.     Mouth/Throat:     Pharynx: Oropharynx is clear.  Eyes:     Extraocular Movements: Extraocular movements intact.     Pupils: Pupils are equal, round, and reactive to light.  Cardiovascular:     Rate and Rhythm: Normal rate and regular rhythm.  Pulmonary:     Comments: Decreased breath sounds bilaterally.  Abdominal:     Palpations: Abdomen is soft.  Musculoskeletal:        General: Normal range of motion.     Cervical back: Normal range of motion.  Skin:    General: Skin is warm.  Neurological:     General: No focal deficit present.     Mental Status: She is alert and oriented to person, place, and time.  Psychiatric:        Behavior: Behavior normal.        Judgment: Judgment normal.    LABORATORY DATA:  I have reviewed the data as listed Lab Results  Component Value Date   WBC 13.6 (H) 08/08/2020   HGB 12.6 08/08/2020   HCT 37.4 08/08/2020   MCV 80.4 08/08/2020   PLT 206 08/08/2020   No results for input(s): NA, K, CL, CO2, GLUCOSE, BUN, CREATININE, CALCIUM, GFRNONAA, GFRAA, PROT, ALBUMIN, AST, ALT, ALKPHOS, BILITOT, BILIDIR, IBILI in the last 8760 hours.   MM 3D SCREEN BREAST BILATERAL  Result Date: 09/30/2021 CLINICAL DATA:  Screening. EXAM: DIGITAL SCREENING BILATERAL MAMMOGRAM WITH TOMOSYNTHESIS AND CAD TECHNIQUE: Bilateral screening digital craniocaudal and mediolateral oblique mammograms were obtained. Bilateral screening digital breast tomosynthesis was performed. The images were evaluated with computer-aided detection. COMPARISON:   Previous exam(s). ACR Breast Density Category b: There are scattered areas of fibroglandular density. FINDINGS: There are no findings suspicious for malignancy. IMPRESSION: No mammographic evidence of malignancy. A result letter of this screening mammogram will be mailed directly to the patient. RECOMMENDATION: Screening mammogram in one year. (Code:SM-B-01Y) BI-RADS CATEGORY  1: Negative. Electronically Signed   By: Audie Pinto M.D.   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  Otherwise mild anemia; chronic kidney disease stage III; noted to have hypercalcemia.  No evidence of any active multiple myeloma.  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  Iron saturation 23%.  Continue monitoring for now not on oral iron.  #Chronic kidney disease-stage III-followed by PCP.  Thank you Dr.Raju for allowing me to participate in the care of your pleasant patient. Please do not hesitate to contact me with questions or concerns in the interim.   * will call pt re: results  # DISPOSITION: # sign release to get records from  PCP office # no labs today # # Follow up to in mid- DEC 2023- 2 weeks prior- MM panel; k/l light chain;cbc/cmp;iron studies/ferritin- Dr.B    All questions were answered. The patient knows to call the clinic with any problems, questions or concerns.      Cammie Sickle, MD 10/22/2021 11:36 PM

## 2021-11-07 ENCOUNTER — Encounter: Payer: Self-pay | Admitting: Internal Medicine

## 2022-03-12 ENCOUNTER — Other Ambulatory Visit: Payer: Self-pay | Admitting: Internal Medicine

## 2022-03-12 ENCOUNTER — Ambulatory Visit
Admission: RE | Admit: 2022-03-12 | Discharge: 2022-03-12 | Disposition: A | Discharge status: Home / Self Care | Payer: Medicare Other | Source: Ambulatory Visit | Attending: Internal Medicine | Admitting: Internal Medicine

## 2022-03-12 DIAGNOSIS — M25562 Pain in left knee: Secondary | ICD-10-CM

## 2022-04-14 ENCOUNTER — Inpatient Hospital Stay: Payer: Medicare Other | Attending: Internal Medicine

## 2022-04-14 DIAGNOSIS — N183 Chronic kidney disease, stage 3 unspecified: Secondary | ICD-10-CM | POA: Diagnosis not present

## 2022-04-14 DIAGNOSIS — D631 Anemia in chronic kidney disease: Secondary | ICD-10-CM | POA: Diagnosis not present

## 2022-04-14 DIAGNOSIS — D472 Monoclonal gammopathy: Secondary | ICD-10-CM | POA: Insufficient documentation

## 2022-04-14 LAB — COMPREHENSIVE METABOLIC PANEL
ALT: 19 U/L (ref 0–44)
AST: 22 U/L (ref 15–41)
Albumin: 4.5 g/dL (ref 3.5–5.0)
Alkaline Phosphatase: 90 U/L (ref 38–126)
Anion gap: 6 (ref 5–15)
BUN: 23 mg/dL (ref 8–23)
CO2: 24 mmol/L (ref 22–32)
Calcium: 9.7 mg/dL (ref 8.9–10.3)
Chloride: 109 mmol/L (ref 98–111)
Creatinine, Ser: 1.27 mg/dL — ABNORMAL HIGH (ref 0.44–1.00)
GFR, Estimated: 44 mL/min — ABNORMAL LOW (ref >60)
Glucose, Bld: 106 mg/dL — ABNORMAL HIGH (ref 70–99)
Potassium: 3.9 mmol/L (ref 3.5–5.1)
Sodium: 139 mmol/L (ref 135–145)
Total Bilirubin: 0.5 mg/dL (ref 0.3–1.2)
Total Protein: 7.6 g/dL (ref 6.5–8.1)

## 2022-04-14 LAB — CBC WITH DIFFERENTIAL/PLATELET
Abs Immature Granulocytes: 0.02 10*3/uL (ref 0.00–0.07)
Basophils Absolute: 0.1 10*3/uL (ref 0.0–0.1)
Basophils Relative: 1 %
Eosinophils Absolute: 0.4 10*3/uL (ref 0.0–0.5)
Eosinophils Relative: 7 %
HCT: 35.3 % — ABNORMAL LOW (ref 36.0–46.0)
Hemoglobin: 11.9 g/dL — ABNORMAL LOW (ref 12.0–15.0)
Immature Granulocytes: 0 %
Lymphocytes Relative: 33 %
Lymphs Abs: 1.9 10*3/uL (ref 0.7–4.0)
MCH: 26.8 pg (ref 26.0–34.0)
MCHC: 33.7 g/dL (ref 30.0–36.0)
MCV: 79.5 fL — ABNORMAL LOW (ref 80.0–100.0)
Monocytes Absolute: 0.4 10*3/uL (ref 0.1–1.0)
Monocytes Relative: 6 %
Neutro Abs: 3 10*3/uL (ref 1.7–7.7)
Neutrophils Relative %: 53 %
Platelets: 211 10*3/uL (ref 150–400)
RBC: 4.44 MIL/uL (ref 3.87–5.11)
RDW: 14.9 % (ref 11.5–15.5)
WBC: 5.8 10*3/uL (ref 4.0–10.5)
nRBC: 0 % (ref 0.0–0.2)

## 2022-04-14 LAB — FERRITIN: Ferritin: 61 ng/mL (ref 11–307)

## 2022-04-14 LAB — IRON AND TIBC
Iron: 66 ug/dL (ref 28–170)
Saturation Ratios: 21 % (ref 10.4–31.8)
TIBC: 318 ug/dL (ref 250–450)
UIBC: 252 ug/dL

## 2022-04-15 LAB — KAPPA/LAMBDA LIGHT CHAINS
Kappa free light chain: 27.3 mg/L — ABNORMAL HIGH (ref 3.3–19.4)
Kappa, lambda light chain ratio: 2.01 — ABNORMAL HIGH (ref 0.26–1.65)
Lambda free light chains: 13.6 mg/L (ref 5.7–26.3)

## 2022-04-21 LAB — MULTIPLE MYELOMA PANEL, SERUM
Albumin SerPl Elph-Mcnc: 4.4 g/dL (ref 2.9–4.4)
Albumin/Glob SerPl: 1.4 (ref 0.7–1.7)
Alpha 1: 0.2 g/dL (ref 0.0–0.4)
Alpha2 Glob SerPl Elph-Mcnc: 0.7 g/dL (ref 0.4–1.0)
B-Globulin SerPl Elph-Mcnc: 1.1 g/dL (ref 0.7–1.3)
Gamma Glob SerPl Elph-Mcnc: 1.3 g/dL (ref 0.4–1.8)
Globulin, Total: 3.3 g/dL (ref 2.2–3.9)
IgA: 120 mg/dL (ref 64–422)
IgG (Immunoglobin G), Serum: 1210 mg/dL (ref 586–1602)
IgM (Immunoglobulin M), Srm: 89 mg/dL (ref 26–217)
M Protein SerPl Elph-Mcnc: 0.2 g/dL — ABNORMAL HIGH
Total Protein ELP: 7.7 g/dL (ref 6.0–8.5)

## 2022-04-28 ENCOUNTER — Ambulatory Visit: Payer: Medicare Other | Admitting: Internal Medicine

## 2022-06-17 ENCOUNTER — Inpatient Hospital Stay: Payer: Medicare Other | Attending: Internal Medicine | Admitting: Internal Medicine

## 2022-06-17 VITALS — BP 114/74 | HR 68 | Temp 95.9°F | Resp 16 | Wt 166.0 lb

## 2022-06-17 DIAGNOSIS — D472 Monoclonal gammopathy: Secondary | ICD-10-CM | POA: Insufficient documentation

## 2022-06-17 DIAGNOSIS — Z806 Family history of leukemia: Secondary | ICD-10-CM | POA: Diagnosis not present

## 2022-06-17 DIAGNOSIS — N183 Chronic kidney disease, stage 3 unspecified: Secondary | ICD-10-CM | POA: Insufficient documentation

## 2022-06-17 DIAGNOSIS — Z807 Family history of other malignant neoplasms of lymphoid, hematopoietic and related tissues: Secondary | ICD-10-CM | POA: Insufficient documentation

## 2022-06-17 NOTE — Progress Notes (Signed)
Patient has question about a knot on the right hand.

## 2022-06-17 NOTE — Assessment & Plan Note (Addendum)
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  Otherwise mild anemia; chronic kidney disease stage III; noted to have NO  hypercalcemia.  No evidence of any active multiple myeloma.  # Hemoglobin- NOV 2023 11.9; MCV 79- ?; Iron saturation -21; ferritin-61- Continue monitoring for now not on oral iron. Colonoscopy in JAN 2024-"small polyp" [Dr.Hung, [place]]  # CKD stage III-GFR 44- stable.   # DISPOSITION: # # Follow up to in 6 months- 2 weeks prior- MM panel; k/l light chain;cbc/cmp;iron studies/ferritin- Dr.B

## 2022-06-17 NOTE — Progress Notes (Signed)
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  # CKD stage- III [PCP]; PN; DM  HISTORY OF PRESENTING ILLNESS: Alone.  Ambulating independently.  Lauren Spears 77 y.o.  female is here for follow-up of monoclonal gammopathy/ CKD is here for follow-up.  Patient states that she was diagnosed with monoclonal gammopathy during part of work-up of chronic kidney disease in [place] about 5 years ago.  Patient stated she also had a bone marrow biopsy as part of work-up.  No unusual bone pain.  Chronic mild fatigue.  Tingling numbness in extremities-attributable to her diabetes.  Review of Systems  Constitutional:  Positive for malaise/fatigue. Negative for chills, diaphoresis, fever and weight loss.  HENT:  Negative for nosebleeds and sore throat.   Eyes:  Negative for double vision.  Respiratory:  Negative for cough, hemoptysis, sputum production, shortness of breath and wheezing.   Cardiovascular:  Negative for chest pain, palpitations, orthopnea and leg swelling.  Gastrointestinal:  Negative for abdominal pain, blood in stool, constipation, diarrhea, heartburn, melena, nausea and vomiting.  Genitourinary:  Negative for dysuria, frequency and urgency.  Musculoskeletal:  Positive for back pain and joint pain.  Skin: Negative.  Negative for itching and rash.  Neurological:  Positive for tingling. Negative for dizziness, focal weakness, weakness and headaches.  Endo/Heme/Allergies:  Does not bruise/bleed easily.  Psychiatric/Behavioral:  Negative for depression. The patient is not nervous/anxious and does not have insomnia.   All other systems reviewed and are negative.   MEDICAL  HISTORY:  Past Medical History:  Diagnosis Date   Diabetes mellitus without complication ([place])    Hyperlipidemia    Hypertension    Renal disorder     SURGICAL HISTORY: Past Surgical History:  Procedure Laterality Date   ABDOMINAL HYSTERECTOMY     BREAST BIOPSY Right 2002   neg    SOCIAL HISTORY: Social History   Socioeconomic History   Marital status: Widowed    Spouse name: Not on file   Number of children: 1   Years of education: Not on file   Highest education level: Not on file  Occupational History   Not on file  Tobacco Use   Smoking status: Never   Smokeless tobacco: Never  Vaping Use   Vaping Use: Never used  Substance and Sexual Activity   Alcohol use: Never   Drug use: Never   Sexual activity: Not on file  Other Topics Concern   Not on file  Social History Narrative   Moved from [place] in 2019; lives in [place] with daughter. Never smoked; no alcohol. Used to retd clerk for telephone.    Social Determinants of Health   Financial Resource Strain: Not on file  Food Insecurity: Not on file  Transportation Needs: Not on file  Physical Activity: Not on file  Stress: Not on file  Social Connections: Not on file  Intimate Partner Violence: Not on file    FAMILY HISTORY: Family History  Problem Relation Age of Onset   Leukemia Mother 74   Sickle cell anemia Father 39   Heart disease Sister    Multiple myeloma Sister 38   Heart disease Brother    Prostate cancer Brother 29   Prostate cancer Brother 3    ALLERGIES:  has No  Known Allergies.  MEDICATIONS:  Current Outpatient Medications  Medication Sig Dispense Refill   ACCU-CHEK GUIDE test strip AS DIRECTED WHEN CHECKING BLOOD GLUCOSE UP TO ONCE A DAY IN VITRO 90 DAYS     amLODipine (NORVASC) 5 MG tablet Take 5 mg by mouth daily.     atorvastatin (LIPITOR) 40 MG tablet Take 40 mg by mouth daily.     Blood Glucose Monitoring Suppl (ACCU-CHEK GUIDE) w/Device KIT AS DIRECTED WHEN CHECKING BLOOD GLUCOSE  UP TO ONCE A DAY (DX: E11.42) 90 DAYS     cholecalciferol (VITAMIN D3) 25 MCG (1000 UNIT) tablet 1,000 Units daily.     nitroGLYCERIN (NITROSTAT) 0.4 MG SL tablet Place 0.4 mg under the tongue as directed.     aspirin EC 81 MG tablet Take 81 mg by mouth daily. Swallow whole. (Patient not taking: Reported on 10/22/2021)     No current facility-administered medications for this visit.      PHYSICAL EXAMINATION:   Vitals:   06/17/22 1000  BP: 114/74  Pulse: 68  Resp: 16  Temp: (!) 95.9 F (35.5 C)   Filed Weights   06/17/22 1000  Weight: 166 lb (75.3 kg)    Physical Exam Vitals and nursing note reviewed.  HENT:     Head: Normocephalic and atraumatic.     Mouth/Throat:     Pharynx: Oropharynx is clear.  Eyes:     Extraocular Movements: Extraocular movements intact.     Pupils: Pupils are equal, round, and reactive to light.  Cardiovascular:     Rate and Rhythm: Normal rate and regular rhythm.  Pulmonary:     Comments: Decreased breath sounds bilaterally.  Abdominal:     Palpations: Abdomen is soft.  Musculoskeletal:        General: Normal range of motion.     Cervical back: Normal range of motion.  Skin:    General: Skin is warm.  Neurological:     General: No focal deficit present.     Mental Status: She is alert and oriented to person, place, and time.  Psychiatric:        Behavior: Behavior normal.        Judgment: Judgment normal.     LABORATORY DATA:  I have reviewed the data as listed Lab Results  Component Value Date   WBC 5.8 04/14/2022   HGB 11.9 (L) 04/14/2022   HCT 35.3 (L) 04/14/2022   MCV 79.5 (L) 04/14/2022   PLT 211 04/14/2022   Recent Labs    04/14/22 1143  NA 139  K 3.9  CL 109  CO2 24  GLUCOSE 106*  BUN 23  CREATININE 1.27*  CALCIUM 9.7  GFRNONAA 44*  PROT 7.6  ALBUMIN 4.5  AST 22  ALT 19  ALKPHOS 90  BILITOT 0.5     No results found.  Lab Results  Component Value Date   KPAFRELGTCHN 27.3 (H) 04/14/2022   LAMBDASER  13.6 04/14/2022   KAPLAMBRATIO 2.01 (H) 04/14/2022     Monoclonal gammopathy # [[place]] Monoclonal gammoathy- K/L= 1.99 [April 2012]; November 2023-myeloma panel IgG kappa 0.2 g/dL; K/L= 2.0.  Otherwise mild anemia; chronic kidney disease stage III; noted to have NO  hypercalcemia.  No evidence of any active multiple myeloma.  # Hemoglobin- NOV 2023 11.9; MCV 79- ?; Iron saturation -21; ferritin-61- Continue monitoring for now not on oral iron. Colonoscopy in JAN 2024-"small polyp" [Dr.Hung, [place]]  # CKD stage III-GFR 44- stable.   # DISPOSITION: # # Follow up  to in 6 months- 2 weeks prior- MM panel; k/l light chain;cbc/cmp;iron studies/ferritin- Dr.B    All questions were answered. The patient knows to call the clinic with any problems, questions or concerns.      Cammie Sickle, MD 06/17/2022 11:10 AM

## 2022-09-15 ENCOUNTER — Other Ambulatory Visit: Payer: Self-pay | Admitting: Internal Medicine

## 2022-09-15 DIAGNOSIS — Z780 Asymptomatic menopausal state: Secondary | ICD-10-CM

## 2022-10-20 ENCOUNTER — Other Ambulatory Visit: Payer: Self-pay | Admitting: Internal Medicine

## 2022-10-20 DIAGNOSIS — Z1231 Encounter for screening mammogram for malignant neoplasm of breast: Secondary | ICD-10-CM

## 2022-12-02 ENCOUNTER — Inpatient Hospital Stay: Payer: Medicare Other | Attending: Internal Medicine

## 2022-12-02 DIAGNOSIS — Z8042 Family history of malignant neoplasm of prostate: Secondary | ICD-10-CM | POA: Insufficient documentation

## 2022-12-02 DIAGNOSIS — Z806 Family history of leukemia: Secondary | ICD-10-CM | POA: Insufficient documentation

## 2022-12-02 DIAGNOSIS — D472 Monoclonal gammopathy: Secondary | ICD-10-CM | POA: Insufficient documentation

## 2022-12-02 DIAGNOSIS — I129 Hypertensive chronic kidney disease with stage 1 through stage 4 chronic kidney disease, or unspecified chronic kidney disease: Secondary | ICD-10-CM | POA: Diagnosis not present

## 2022-12-02 DIAGNOSIS — N183 Chronic kidney disease, stage 3 unspecified: Secondary | ICD-10-CM | POA: Diagnosis not present

## 2022-12-02 DIAGNOSIS — Z807 Family history of other malignant neoplasms of lymphoid, hematopoietic and related tissues: Secondary | ICD-10-CM | POA: Insufficient documentation

## 2022-12-02 LAB — CBC WITH DIFFERENTIAL/PLATELET
Abs Immature Granulocytes: 0.01 10*3/uL (ref 0.00–0.07)
Basophils Absolute: 0.1 10*3/uL (ref 0.0–0.1)
Basophils Relative: 1 %
Eosinophils Absolute: 0.1 10*3/uL (ref 0.0–0.5)
Eosinophils Relative: 2 %
HCT: 36.1 % (ref 36.0–46.0)
Hemoglobin: 12.1 g/dL (ref 12.0–15.0)
Immature Granulocytes: 0 %
Lymphocytes Relative: 32 %
Lymphs Abs: 1.7 10*3/uL (ref 0.7–4.0)
MCH: 26.5 pg (ref 26.0–34.0)
MCHC: 33.5 g/dL (ref 30.0–36.0)
MCV: 79 fL — ABNORMAL LOW (ref 80.0–100.0)
Monocytes Absolute: 0.4 10*3/uL (ref 0.1–1.0)
Monocytes Relative: 7 %
Neutro Abs: 3.1 10*3/uL (ref 1.7–7.7)
Neutrophils Relative %: 58 %
Platelets: 209 10*3/uL (ref 150–400)
RBC: 4.57 MIL/uL (ref 3.87–5.11)
RDW: 15.2 % (ref 11.5–15.5)
WBC: 5.4 10*3/uL (ref 4.0–10.5)
nRBC: 0 % (ref 0.0–0.2)

## 2022-12-02 LAB — COMPREHENSIVE METABOLIC PANEL
ALT: 15 U/L (ref 0–44)
AST: 18 U/L (ref 15–41)
Albumin: 4.5 g/dL (ref 3.5–5.0)
Alkaline Phosphatase: 98 U/L (ref 38–126)
Anion gap: 8 (ref 5–15)
BUN: 18 mg/dL (ref 8–23)
CO2: 25 mmol/L (ref 22–32)
Calcium: 9.6 mg/dL (ref 8.9–10.3)
Chloride: 104 mmol/L (ref 98–111)
Creatinine, Ser: 1.19 mg/dL — ABNORMAL HIGH (ref 0.44–1.00)
GFR, Estimated: 47 mL/min — ABNORMAL LOW (ref >60)
Glucose, Bld: 97 mg/dL (ref 70–99)
Potassium: 3.8 mmol/L (ref 3.5–5.1)
Sodium: 137 mmol/L (ref 135–145)
Total Bilirubin: 0.6 mg/dL (ref 0.3–1.2)
Total Protein: 8 g/dL (ref 6.5–8.1)

## 2022-12-02 LAB — IRON AND TIBC
Iron: 82 ug/dL (ref 28–170)
Saturation Ratios: 22 % (ref 10.4–31.8)
TIBC: 370 ug/dL (ref 250–450)
UIBC: 288 ug/dL

## 2022-12-02 LAB — FERRITIN: Ferritin: 49 ng/mL (ref 11–307)

## 2022-12-03 ENCOUNTER — Ambulatory Visit
Admission: RE | Admit: 2022-12-03 | Discharge: 2022-12-03 | Disposition: A | Discharge status: Home / Self Care | Payer: Medicare Other | Source: Ambulatory Visit | Attending: Internal Medicine | Admitting: Internal Medicine

## 2022-12-03 DIAGNOSIS — Z1231 Encounter for screening mammogram for malignant neoplasm of breast: Secondary | ICD-10-CM | POA: Diagnosis present

## 2022-12-03 DIAGNOSIS — Z780 Asymptomatic menopausal state: Secondary | ICD-10-CM

## 2022-12-03 LAB — KAPPA/LAMBDA LIGHT CHAINS
Kappa free light chain: 24.5 mg/L — ABNORMAL HIGH (ref 3.3–19.4)
Kappa, lambda light chain ratio: 1.81 — ABNORMAL HIGH (ref 0.26–1.65)
Lambda free light chains: 13.5 mg/L (ref 5.7–26.3)

## 2022-12-06 LAB — MULTIPLE MYELOMA PANEL, SERUM
Albumin SerPl Elph-Mcnc: 3.9 g/dL (ref 2.9–4.4)
Albumin/Glob SerPl: 1.2 (ref 0.7–1.7)
Alpha 1: 0.2 g/dL (ref 0.0–0.4)
Alpha2 Glob SerPl Elph-Mcnc: 0.7 g/dL (ref 0.4–1.0)
B-Globulin SerPl Elph-Mcnc: 1.1 g/dL (ref 0.7–1.3)
Gamma Glob SerPl Elph-Mcnc: 1.3 g/dL (ref 0.4–1.8)
Globulin, Total: 3.3 g/dL (ref 2.2–3.9)
IgA: 109 mg/dL (ref 64–422)
IgG (Immunoglobin G), Serum: 1351 mg/dL (ref 586–1602)
IgM (Immunoglobulin M), Srm: 94 mg/dL (ref 26–217)
M Protein SerPl Elph-Mcnc: 0.2 g/dL — ABNORMAL HIGH
Total Protein ELP: 7.2 g/dL (ref 6.0–8.5)

## 2022-12-15 ENCOUNTER — Other Ambulatory Visit: Payer: Self-pay | Admitting: Internal Medicine

## 2022-12-15 DIAGNOSIS — R918 Other nonspecific abnormal finding of lung field: Secondary | ICD-10-CM

## 2022-12-16 ENCOUNTER — Encounter: Payer: Self-pay | Admitting: Internal Medicine

## 2022-12-16 ENCOUNTER — Inpatient Hospital Stay: Payer: Medicare Other | Admitting: Internal Medicine

## 2022-12-16 VITALS — BP 110/66 | HR 79 | Temp 97.8°F | Wt 164.0 lb

## 2022-12-16 DIAGNOSIS — D472 Monoclonal gammopathy: Secondary | ICD-10-CM | POA: Diagnosis not present

## 2022-12-16 NOTE — Assessment & Plan Note (Addendum)
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 2019--normal trilineage hematopoiesis; no evidence of monoclonal protein. K/L= 1.99; November 2023-myeloma panel IgG kappa 0.2 g/dL; K/L= 2.0.  Otherwise mild anemia; chronic kidney disease stage III; noted to have NO  hypercalcemia. JULY 2024- 0.2 gm/dl No evidence of any active multiple myeloma.stable.    # Hemoglobin- NOV 2023 11.9; MCV 79- ?; Iron saturation -21; ferritin-61; JULY 2024 Hb -12. Continue monitoring for now not on oral iron. Colonoscopy in JAN 2024-"small polyp" [Dr.Hung, [place]]- stable/   # CKD stage III-stable.   # "low Blood glucose"- defer to PCP for further work up.   # DISPOSITION: # # Follow up to in 6 months- 2 weeks prior- MM panel; k/l light chain;cbc/cmp;iron studies/ferritin- Dr.B

## 2022-12-16 NOTE — Progress Notes (Signed)
Patient denies any concerns at this time 

## 2022-12-16 NOTE — Progress Notes (Signed)
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  # CKD stage- III [PCP]; PN; DM  HISTORY OF PRESENTING ILLNESS: Alone.  Ambulating independently.  Lauren Spears 77 y.o.  female is here for follow-up of monoclonal gammopathy/ CKD is here for follow-up.  For last 2 weeks- Concerns of "low blood glucose"- 90s FBG. Post prandial- 116. Pt get symptoms of fatigue/ and palpitations.   No unusual bone pain.  Chronic mild fatigue.  Tingling numbness in extremities-attributable to her diabetes.  Review of Systems  Constitutional:  Positive for malaise/fatigue. Negative for chills, diaphoresis, fever and weight loss.  HENT:  Negative for nosebleeds and sore throat.   Eyes:  Negative for double vision.  Respiratory:  Negative for cough, hemoptysis, sputum production, shortness of breath and wheezing.   Cardiovascular:  Negative for chest pain, palpitations, orthopnea and leg swelling.  Gastrointestinal:  Negative for abdominal pain, blood in stool, constipation, diarrhea, heartburn, melena, nausea and vomiting.  Genitourinary:  Negative for dysuria, frequency and urgency.  Musculoskeletal:  Positive for back pain and joint pain.  Skin: Negative.  Negative for itching and rash.  Neurological:  Positive for tingling. Negative for dizziness, focal weakness, weakness and headaches.  Endo/Heme/Allergies:  Does not bruise/bleed easily.  Psychiatric/Behavioral:  Negative for depression. The patient is not nervous/anxious and does not have insomnia.   All other systems reviewed and are negative.   MEDICAL HISTORY:  Past Medical History:   Diagnosis Date   Diabetes mellitus without complication (H[facility])    Hyperlipidemia    Hypertension    Renal disorder     SURGICAL HISTORY: Past Surgical History:  Procedure Laterality Date   ABDOMINAL HYSTERECTOMY     BREAST BIOPSY Right 2002   neg    SOCIAL HISTORY: Social History   Socioeconomic History   Marital status: Widowed    Spouse name: Not on file   Number of children: 1   Years of education: Not on file   Highest education level: Not on file  Occupational History   Not on file  Tobacco Use   Smoking status: Never   Smokeless tobacco: Never  Vaping Use   Vaping status: Never Used  Substance and Sexual Activity   Alcohol use: Never   Drug use: Never   Sexual activity: Not on file  Other Topics Concern   Not on file  Social History Narrative   Moved from [place] in 2019; lives in [place] with daughter. Never smoked; no alcohol. Used to retd clerk for telephone.    Social Determinants of Health   Financial Resource Strain: Not on file  Food Insecurity: Not on file  Transportation Needs: Not on file  Physical Activity: Not on file  Stress: Not on file  Social Connections: Not on file  Intimate Partner Violence: Not on file    FAMILY HISTORY: Family History  Problem Relation Age of Onset   Leukemia Mother 44   Sickle cell anemia Father 76   Heart disease Sister    Multiple myeloma Sister 20   Heart disease Brother    Prostate cancer Brother 77   Prostate cancer Brother 32    ALLERGIES:  has No Known Allergies.  MEDICATIONS:  Current Outpatient  Medications  Medication Sig Dispense Refill   ACCU-CHEK GUIDE test strip AS DIRECTED WHEN CHECKING BLOOD GLUCOSE UP TO ONCE A DAY IN VITRO 90 DAYS     amLODipine (NORVASC) 5 MG tablet Take 5 mg by mouth daily.     atorvastatin (LIPITOR) 40 MG tablet Take 40 mg by mouth daily.     Blood Glucose Monitoring Suppl (ACCU-CHEK GUIDE) w/Device KIT AS DIRECTED WHEN CHECKING BLOOD GLUCOSE UP TO ONCE A DAY (DX: E11.42)  90 DAYS     cholecalciferol (VITAMIN D3) 25 MCG (1000 UNIT) tablet 1,000 Units daily.     nitroGLYCERIN (NITROSTAT) 0.4 MG SL tablet Place 0.4 mg under the tongue as directed.     pantoprazole (PROTONIX) 40 MG tablet Take 40 mg by mouth daily.     aspirin EC 81 MG tablet Take 81 mg by mouth daily. Swallow whole. (Patient not taking: Reported on 10/22/2021)     No current facility-administered medications for this visit.      PHYSICAL EXAMINATION:   Vitals:   12/16/22 1309  BP: 110/66  Pulse: 79  Temp: 97.8 F (36.6 C)  SpO2: 100%    Filed Weights   12/16/22 1309  Weight: 164 lb (74.4 kg)     Physical Exam Vitals and nursing note reviewed.  HENT:     Head: Normocephalic and atraumatic.     Mouth/Throat:     Pharynx: Oropharynx is clear.  Eyes:     Extraocular Movements: Extraocular movements intact.     Pupils: Pupils are equal, round, and reactive to light.  Cardiovascular:     Rate and Rhythm: Normal rate and regular rhythm.  Pulmonary:     Comments: Decreased breath sounds bilaterally.  Abdominal:     Palpations: Abdomen is soft.  Musculoskeletal:        General: Normal range of motion.     Cervical back: Normal range of motion.  Skin:    General: Skin is warm.  Neurological:     General: No focal deficit present.     Mental Status: She is alert and oriented to person, place, and time.  Psychiatric:        Behavior: Behavior normal.        Judgment: Judgment normal.     LABORATORY DATA:  I have reviewed the data as listed Lab Results  Component Value Date   WBC 5.4 12/02/2022   HGB 12.1 12/02/2022   HCT 36.1 12/02/2022   MCV 79.0 (L) 12/02/2022   PLT 209 12/02/2022   Recent Labs    04/14/22 1143 12/02/22 1130  NA 139 137  K 3.9 3.8  CL 109 104  CO2 24 25  GLUCOSE 106* 97  BUN 23 18  CREATININE 1.27* 1.19*  CALCIUM 9.7 9.6  GFRNONAA 44* 47*  PROT 7.6 8.0  ALBUMIN 4.5 4.5  AST 22 18  ALT 19 15  ALKPHOS 90 98  BILITOT 0.5 0.6      MM 3D SCREENING MAMMOGRAM BILATERAL BREAST  Result Date: 12/03/2022 CLINICAL DATA:  Screening. EXAM: DIGITAL SCREENING BILATERAL MAMMOGRAM WITH TOMOSYNTHESIS AND CAD TECHNIQUE: Bilateral screening digital craniocaudal and mediolateral oblique mammograms were obtained. Bilateral screening digital breast tomosynthesis was performed. The images were evaluated with computer-aided detection. COMPARISON:  Previous exam(s). ACR Breast Density Category b: There are scattered areas of fibroglandular density. FINDINGS: There are no findings suspicious for malignancy. IMPRESSION: No mammographic evidence of malignancy. A result letter of this screening mammogram will be mailed directly to the patient.  RECOMMENDATION: Screening mammogram in one year. (Code:SM-B-01Y) BI-RADS CATEGORY  1: Negative. Electronically Signed   By: Ted Mcalpine M.D.   On: 12/03/2022 15:44   DG BONE DENSITY (DXA)  Result Date: 12/03/2022 EXAM: DUAL X-RAY ABSORPTIOMETRY (DXA) FOR BONE MINERAL DENSITY IMPRESSION: Your patient Solymar Trees completed a BMD test on 12/03/2022 using the [employer] iDXA DXA System (software version: 14.10) manufactured by [employer]. The following summarizes the results of our evaluation. Technologist: SCE PATIENT BIOGRAPHICAL: Name: Tristy, Vaden Patient ID: [id number] Birth Date: July 08, 1945 Height: 65.0 in. Gender: Female Exam Date: 12/03/2022 Weight: 167.6 lbs. Indications: Advanced Age, Diabetic, History of kidney disease, Hypothyroid, Oophorectomy Bilateral, Osteoarthritis, Postmenopausal Fractures: Treatments: Protonix, Vitamin D DENSITOMETRY RESULTS: Site      Region     Measured Date Measured Age WHO Classification Young Adult T-score BMD         %Change vs. Previous Significant Change (*) AP Spine L2-L4 12/03/20[age] Osteopenia -1.7 1.005 g/cm2 - - DualFemur Neck Right 12/03/2022 76.2 Osteopenia -2.0 0.757 g/cm2 - - ASSESSMENT: The BMD measured at Femur Neck Right is 0.757 g/cm2 with a  T-score of -2.0. This patient is considered osteopenic according to World Health Organization [facility]) criteria. The scan quality is good. L-1 was excluded due to degenerative changes. World Science writer [facility]) criteria for post-menopausal, Caucasian Women: Normal:                   T-score at or above -1 SD Osteopenia/low bone mass: T-score between -1 and -2.5 SD Osteoporosis:             T-score at or below -2.5 SD RECOMMENDATIONS: 1. All patients should optimize calcium and vitamin D intake. 2. Consider FDA-approved medical therapies in postmenopausal women and men aged 26 years and older, based on the following: a. A hip or vertebral(clinical or morphometric) fracture b. T-score < -2.5 at the femoral neck or spine after appropriate evaluation to exclude secondary causes c. Low bone mass (T-score between -1.0 and -2.5 at the femoral neck or spine) and a 10-year probability of a hip fracture > 3% or a 10-year probability of a major osteoporosis-related fracture > 20% based on the [place]-adapted WHO algorithm 3. Clinician judgment and/or patient preferences may indicate treatment for people with 10-year fracture probabilities above or below these levels FOLLOW-UP: People with diagnosed cases of osteoporosis or at high risk for fracture should have regular bone mineral density tests. For patients eligible for Medicare, routine testing is allowed once every 2 years. The testing frequency can be increased to one year for patients who have rapidly progressing disease, those who are receiving or discontinuing medical therapy to restore bone mass, or have additional risk factors. I have reviewed this report, and agree with the above findings. [facility] Dear Thana Ates, Your patient Arelli Schrauben completed a FRAX assessment on 12/03/2022 using the [employer] iDXA DXA System (analysis version: 14.10) manufactured by [employer]. The following summarizes the results of our evaluation. PATIENT BIOGRAPHICAL:  Name: Laikyn, Mcphaul Patient ID: [id number] Birth Date: 09-19-1945 Height:    65.0 in. Gender:     Female    [age]       Weight:    167.6 lbs. Ethnicity:  Black                            Exam Date: 12/03/2022 FRAX* RESULTS:  (version:  3.5) 10-year Probability of Cherlyn Cushing Major Osteoporotic Fracture2 Hip Fracture 6.3% 1.6% Population: [place] (Black) Risk Factors: None Based on Femur (Right) Neck BMD 1 -The 10-year probability of fracture may be lower than reported if the patient has received treatment. 2 -Major Osteoporotic Fracture: Clinical Spine, Forearm, Hip or Shoulder *FRAX is a Armed forces logistics/support/administrative officer of the [employer] of [employer] for Metabolic Bone Disease, a World Science writer (WHO) [employer]. ASSESSMENT: The probability of a major osteoporotic fracture is 6.3% within the next ten years. The probability of a hip fracture is 1.6% within the next ten years. Electronically Signed   By: Frederico Hamman M.D.   On: 12/03/2022 08:54    Lab Results  Component Value Date   KPAFRELGTCHN 24.5 (H) 12/02/2022   KPAFRELGTCHN 27.3 (H) 04/14/2022   LAMBDASER 13.5 12/02/2022   LAMBDASER 13.6 04/14/2022   KAPLAMBRATIO 1.81 (H) 12/02/2022   KAPLAMBRATIO 2.01 (H) 04/14/2022     Monoclonal gammopathy # [[place]] Monoclonal gammoathy- K/L= 1.99; November 2023-myeloma panel IgG kappa 0.2 g/dL; K/L= 2.0.  Otherwise mild anemia; chronic kidney disease stage III; noted to have NO  hypercalcemia. JULY 2024- 0.2 gm/dl No evidence of any active multiple myeloma.stable.    # Hemoglobin- NOV 2023 11.9; MCV 79- ?; Iron saturation -21; ferritin-61; JULY 2024 Hb -12. Continue monitoring for now not on oral iron. Colonoscopy in JAN 2024-"small polyp" [Dr.Hung, [place]]- stable/   # CKD stage III-stable.   # DISPOSITION: # # Follow up to in 6 months- 2 weeks prior- MM panel; k/l light chain;cbc/cmp;iron studies/ferritin- Dr.B    All questions were answered. The patient knows to call  the clinic with any problems, questions or concerns.      Earna Coder, MD 12/16/2022 1:26 PM

## 2022-12-25 ENCOUNTER — Ambulatory Visit: Admission: RE | Admit: 2022-12-25 | Payer: Medicare Other | Source: Ambulatory Visit

## 2022-12-25 DIAGNOSIS — R918 Other nonspecific abnormal finding of lung field: Secondary | ICD-10-CM

## 2023-05-20 IMAGING — CR DG CHEST 2V
2 series · 2 of 2 positions shown · non-contrast
Comparison: August 08, 2020.

CLINICAL DATA: Dyspnea on exertion.

EXAM:
CHEST - 2 VIEW

[w chest pa]
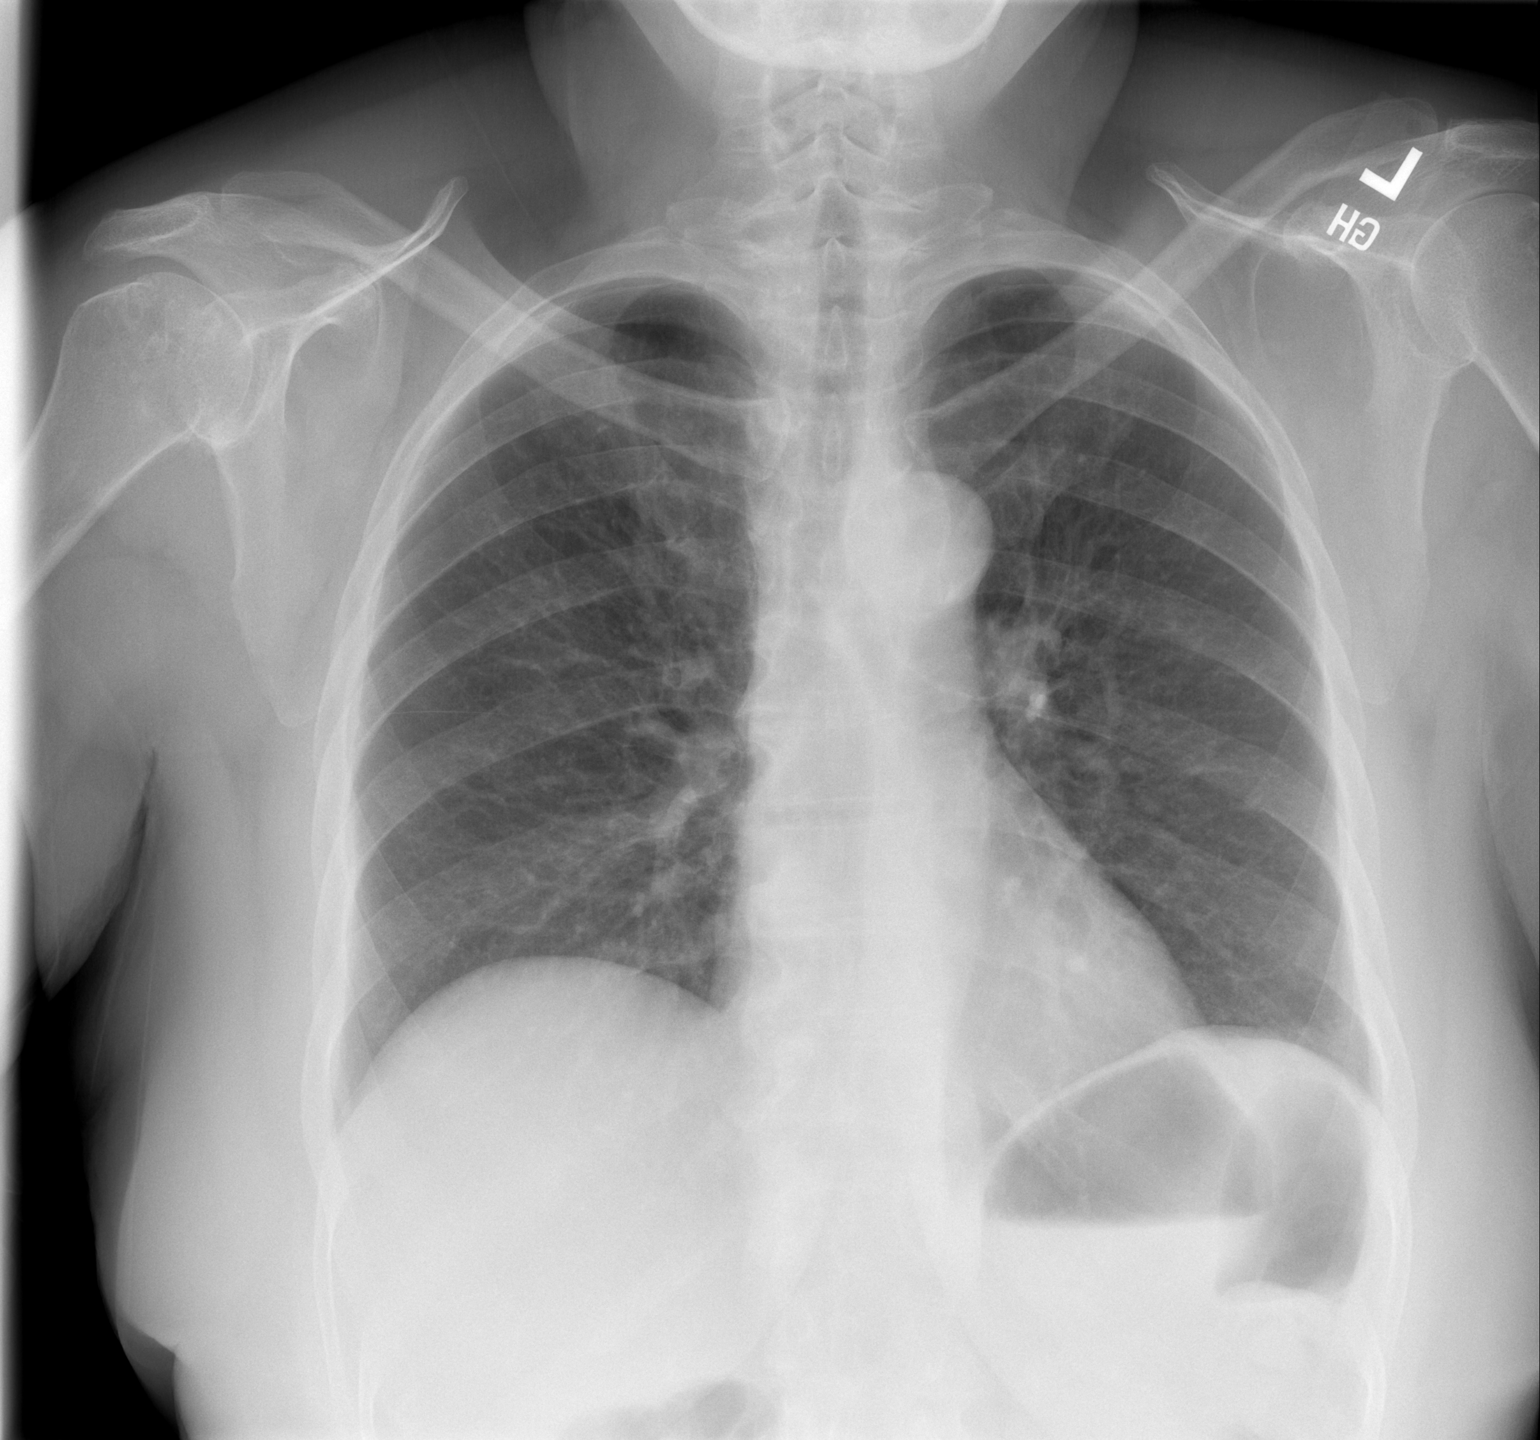

[w chest lat]
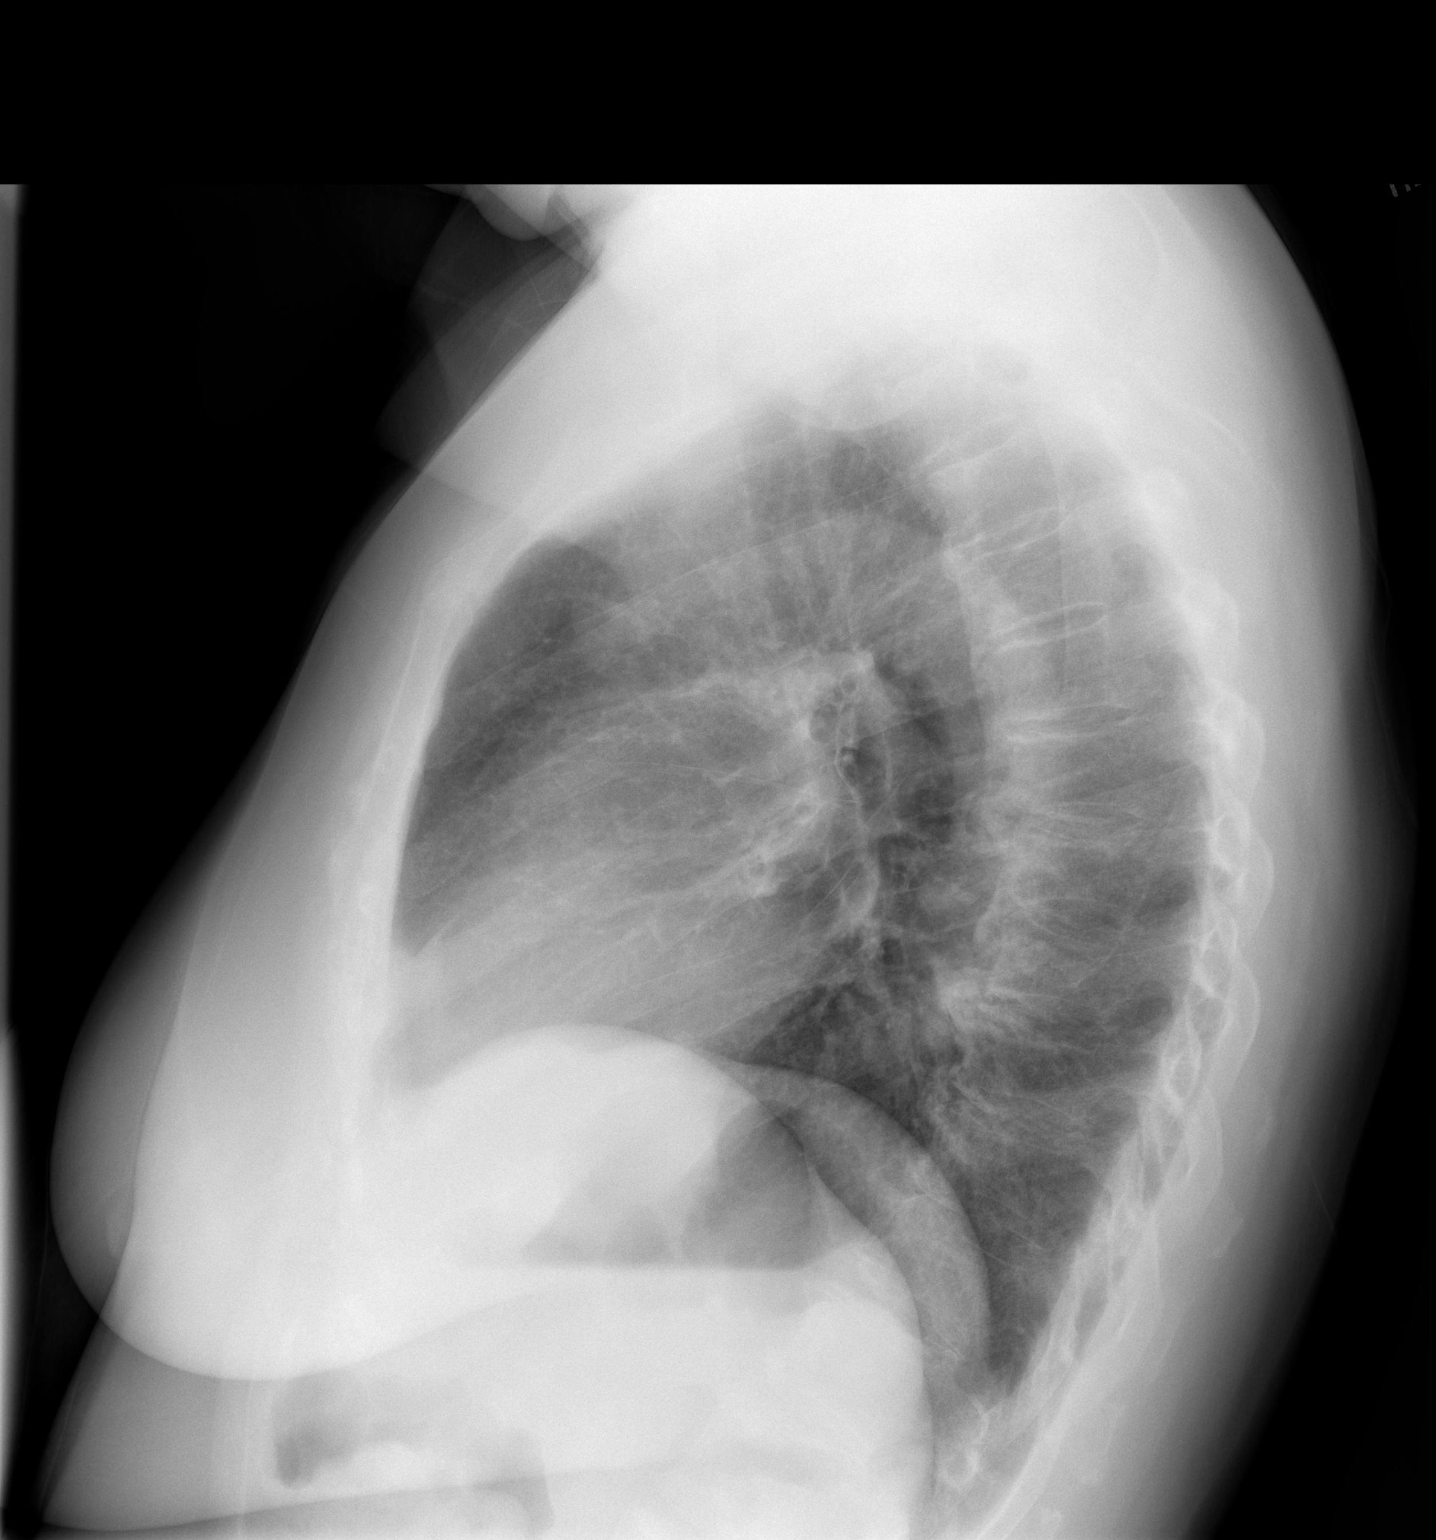

[2 of 2 positions shown; findings below may reference images not displayed]

FINDINGS: The heart size and mediastinal contours are within normal limits.
Both lungs are clear. The visualized skeletal structures are
unremarkable.
IMPRESSION: No active cardiopulmonary disease.

## 2023-06-04 ENCOUNTER — Inpatient Hospital Stay: Payer: Medicare Other | Attending: Internal Medicine

## 2023-06-04 DIAGNOSIS — D472 Monoclonal gammopathy: Secondary | ICD-10-CM | POA: Diagnosis present

## 2023-06-04 DIAGNOSIS — D649 Anemia, unspecified: Secondary | ICD-10-CM | POA: Insufficient documentation

## 2023-06-04 LAB — CBC WITH DIFFERENTIAL (CANCER CENTER ONLY)
Abs Immature Granulocytes: 0.01 10*3/uL (ref 0.00–0.07)
Basophils Absolute: 0.1 10*3/uL (ref 0.0–0.1)
Basophils Relative: 1 %
Eosinophils Absolute: 0.2 10*3/uL (ref 0.0–0.5)
Eosinophils Relative: 3 %
HCT: 35.6 % — ABNORMAL LOW (ref 36.0–46.0)
Hemoglobin: 12 g/dL (ref 12.0–15.0)
Immature Granulocytes: 0 %
Lymphocytes Relative: 32 %
Lymphs Abs: 1.6 10*3/uL (ref 0.7–4.0)
MCH: 26.5 pg (ref 26.0–34.0)
MCHC: 33.7 g/dL (ref 30.0–36.0)
MCV: 78.6 fL — ABNORMAL LOW (ref 80.0–100.0)
Monocytes Absolute: 0.3 10*3/uL (ref 0.1–1.0)
Monocytes Relative: 6 %
Neutro Abs: 2.9 10*3/uL (ref 1.7–7.7)
Neutrophils Relative %: 58 %
Platelet Count: 220 10*3/uL (ref 150–400)
RBC: 4.53 MIL/uL (ref 3.87–5.11)
RDW: 15 % (ref 11.5–15.5)
WBC Count: 5.1 10*3/uL (ref 4.0–10.5)
nRBC: 0 % (ref 0.0–0.2)

## 2023-06-04 LAB — CMP (CANCER CENTER ONLY)
ALT: 15 U/L (ref 0–44)
AST: 18 U/L (ref 15–41)
Albumin: 4.5 g/dL (ref 3.5–5.0)
Alkaline Phosphatase: 108 U/L (ref 38–126)
Anion gap: 11 (ref 5–15)
BUN: 17 mg/dL (ref 8–23)
CO2: 24 mmol/L (ref 22–32)
Calcium: 9.8 mg/dL (ref 8.9–10.3)
Chloride: 104 mmol/L (ref 98–111)
Creatinine: 1.24 mg/dL — ABNORMAL HIGH (ref 0.44–1.00)
GFR, Estimated: 45 mL/min — ABNORMAL LOW (ref >60)
Glucose, Bld: 105 mg/dL — ABNORMAL HIGH (ref 70–99)
Potassium: 3.9 mmol/L (ref 3.5–5.1)
Sodium: 139 mmol/L (ref 135–145)
Total Bilirubin: 1 mg/dL (ref 0.0–1.2)
Total Protein: 7.6 g/dL (ref 6.5–8.1)

## 2023-06-04 LAB — IRON AND TIBC
Iron: 76 ug/dL (ref 28–170)
Saturation Ratios: 21 % (ref 10.4–31.8)
TIBC: 365 ug/dL (ref 250–450)
UIBC: 289 ug/dL

## 2023-06-04 LAB — FERRITIN: Ferritin: 49 ng/mL (ref 11–307)

## 2023-06-07 LAB — KAPPA/LAMBDA LIGHT CHAINS
Kappa free light chain: 24.2 mg/L — ABNORMAL HIGH (ref 3.3–19.4)
Kappa, lambda light chain ratio: 1.97 — ABNORMAL HIGH (ref 0.26–1.65)
Lambda free light chains: 12.3 mg/L (ref 5.7–26.3)

## 2023-06-11 LAB — MULTIPLE MYELOMA PANEL, SERUM
Albumin SerPl Elph-Mcnc: 3.9 g/dL (ref 2.9–4.4)
Albumin/Glob SerPl: 1.2 (ref 0.7–1.7)
Alpha 1: 0.2 g/dL (ref 0.0–0.4)
Alpha2 Glob SerPl Elph-Mcnc: 0.7 g/dL (ref 0.4–1.0)
B-Globulin SerPl Elph-Mcnc: 1.1 g/dL (ref 0.7–1.3)
Gamma Glob SerPl Elph-Mcnc: 1.3 g/dL (ref 0.4–1.8)
Globulin, Total: 3.3 g/dL (ref 2.2–3.9)
IgA: 127 mg/dL (ref 64–422)
IgG (Immunoglobin G), Serum: 1347 mg/dL (ref 586–1602)
IgM (Immunoglobulin M), Srm: 100 mg/dL (ref 26–217)
M Protein SerPl Elph-Mcnc: 0.2 g/dL — ABNORMAL HIGH
Total Protein ELP: 7.2 g/dL (ref 6.0–8.5)

## 2023-06-18 ENCOUNTER — Ambulatory Visit: Payer: Medicare Other | Admitting: Internal Medicine

## 2023-07-02 ENCOUNTER — Encounter: Payer: Self-pay | Admitting: Internal Medicine

## 2023-07-02 ENCOUNTER — Inpatient Hospital Stay: Payer: Medicare Other | Attending: Internal Medicine | Admitting: Internal Medicine

## 2023-07-02 VITALS — BP 124/65 | HR 65 | Temp 97.0°F | Resp 17 | Wt 170.0 lb

## 2023-07-02 DIAGNOSIS — D472 Monoclonal gammopathy: Secondary | ICD-10-CM | POA: Diagnosis not present

## 2023-07-02 DIAGNOSIS — N183 Chronic kidney disease, stage 3 unspecified: Secondary | ICD-10-CM | POA: Diagnosis not present

## 2023-07-02 DIAGNOSIS — E1140 Type 2 diabetes mellitus with diabetic neuropathy, unspecified: Secondary | ICD-10-CM | POA: Insufficient documentation

## 2023-07-02 DIAGNOSIS — Z806 Family history of leukemia: Secondary | ICD-10-CM | POA: Diagnosis not present

## 2023-07-02 NOTE — Assessment & Plan Note (Addendum)
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 2019--normal trilineage hematopoiesis; no evidence of monoclonal protein. K/L= 1.99; November 2023-myeloma panel IgG kappa 0.2 g/dL; K/L= 2.0.  Otherwise mild anemia; chronic kidney disease stage III; noted to have NO  hypercalcemia. FEB 2025- 0.2 gm/dl No evidence of any active multiple myeloma- stable.  # again reviewed the natural history of MGUS-the risk of progression is about 1 %/year.  Low risk the patient.  Have potential progression for the next many years.  I think is reasonable to monitor once a year.pt in agreement.   # Hemoglobin- NOV 2023 11.9; MCV 79- ?; Iron saturation -21; ferritin-61; JULY 2024 Hb -12. Continue monitoring for now not on oral iron. Colonoscopy in JAN 2024-"small polyp" [Dr.Hung, [place]]- stable/  # CKD stage III-stable.    # DISPOSITION: # # Follow up to in 12 months- 2 weeks prior- MM panel; k/l light chain;cbc/cmp;iron studies/ferritin- Dr.B

## 2023-07-02 NOTE — Progress Notes (Signed)
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  # CKD stage- III [PCP]; PN; DM  HISTORY OF PRESENTING ILLNESS: Alone.  Ambulating independently.  Lauren Spears 78 y.o.  female is here for follow-up of monoclonal gammopathy/ CKD is here for follow-up.  Patient has some chronic pain in shoulder and back. 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 Appetite is fair. Eats one good meal per day. Eats sweets during the day. Energy is low.    No unusual bone pain.  Chronic mild fatigue.  Tingling numbness in extremities-attributable to her diabetes.  Review of Systems  Constitutional:  Positive for malaise/fatigue. Negative for chills, diaphoresis, fever and weight loss.  HENT:  Negative for nosebleeds and sore throat.   Eyes:  Negative for double vision.  Respiratory:  Negative for cough, hemoptysis, sputum production, shortness of breath and wheezing.   Cardiovascular:  Negative for chest pain, palpitations, orthopnea and leg swelling.  Gastrointestinal:  Negative for abdominal pain, blood in stool, constipation, diarrhea, heartburn, melena, nausea and vomiting.  Genitourinary:  Negative for dysuria, frequency and urgency.  Musculoskeletal:  Positive for back pain and joint pain.  Skin: Negative.  Negative for itching and rash.  Neurological:  Positive for tingling. Negative for dizziness, focal weakness, weakness and headaches.  Endo/Heme/Allergies:  Does not bruise/bleed easily.  Psychiatric/Behavioral:  Negative for depression. The patient is not nervous/anxious and does not have insomnia.   All other systems  reviewed and are negative.   MEDICAL HISTORY:  Past Medical History:  Diagnosis Date   Diabetes mellitus without complication (HCC)    Hyperlipidemia    Hypertension    Renal disorder     SURGICAL HISTORY: Past Surgical History:  Procedure Laterality Date   ABDOMINAL HYSTERECTOMY     BREAST BIOPSY Right 2002   neg    SOCIAL HISTORY: Social History   Socioeconomic History   Marital status: Widowed    Spouse name: Not on file   Number of children: 1   Years of education: Not on file   Highest education level: Not on file  Occupational History   Not on file  Tobacco Use   Smoking status: Never   Smokeless tobacco: Never  Vaping Use   Vaping status: Never Used  Substance and Sexual Activity   Alcohol use: Never   Drug use: Never   Sexual activity: Not on file  Other Topics Concern   Not on file  Social History Narrative   Moved from [place] in 2019; lives in [place] with daughter. Never smoked; no alcohol. Used to retd clerk for telephone.    Social Drivers of Corporate investment banker Strain: Not on file  Food Insecurity: Not on file  Transportation Needs: Not on file  Physical Activity: Not on file  Stress: Not on file  Social Connections: Not on file  Intimate Partner Violence: Not on file    FAMILY HISTORY: Family History  Problem Relation Age of Onset   Leukemia Mother 18   Sickle cell anemia Father 49   Heart disease Sister    Multiple myeloma Sister 48   Heart disease Brother    Prostate cancer Brother 47   Prostate cancer Brother  56    ALLERGIES:  has no known allergies.  MEDICATIONS:  Current Outpatient Medications  Medication Sig Dispense Refill   ACCU-CHEK GUIDE test strip AS DIRECTED WHEN CHECKING BLOOD GLUCOSE UP TO ONCE A DAY IN VITRO 90 DAYS     amLODipine (NORVASC) 5 MG tablet Take 5 mg by mouth daily.     atorvastatin (LIPITOR) 40 MG tablet Take 40 mg by mouth daily.     Blood Glucose Monitoring Suppl (ACCU-CHEK GUIDE) w/Device KIT AS  DIRECTED WHEN CHECKING BLOOD GLUCOSE UP TO ONCE A DAY (DX: E11.42) 90 DAYS     cholecalciferol (VITAMIN D3) 25 MCG (1000 UNIT) tablet 1,000 Units daily.     pantoprazole (PROTONIX) 40 MG tablet Take 40 mg by mouth daily.     No current facility-administered medications for this visit.      PHYSICAL EXAMINATION:   Vitals:   07/02/23 0932  BP: 124/65  Pulse: 65  Resp: 17  Temp: (!) 97 F (36.1 C)  SpO2: 100%     Filed Weights   07/02/23 0932  Weight: 170 lb (77.1 kg)      Physical Exam Vitals and nursing note reviewed.  HENT:     Head: Normocephalic and atraumatic.     Mouth/Throat:     Pharynx: Oropharynx is clear.  Eyes:     Extraocular Movements: Extraocular movements intact.     Pupils: Pupils are equal, round, and reactive to light.  Cardiovascular:     Rate and Rhythm: Normal rate and regular rhythm.  Pulmonary:     Comments: Decreased breath sounds bilaterally.  Abdominal:     Palpations: Abdomen is soft.  Musculoskeletal:        General: Normal range of motion.     Cervical back: Normal range of motion.  Skin:    General: Skin is warm.  Neurological:     General: No focal deficit present.     Mental Status: She is alert and oriented to person, place, and time.  Psychiatric:        Behavior: Behavior normal.        Judgment: Judgment normal.     LABORATORY DATA:  I have reviewed the data as listed Lab Results  Component Value Date   WBC 5.1 06/04/2023   HGB 12.0 06/04/2023   HCT 35.6 (L) 06/04/2023   MCV 78.6 (L) 06/04/2023   PLT 220 06/04/2023   Recent Labs    12/02/22 1130 06/04/23 1042  NA 137 139  K 3.8 3.9  CL 104 104  CO2 25 24  GLUCOSE 97 105*  BUN 18 17  CREATININE 1.19* 1.24*  CALCIUM 9.6 9.8  GFRNONAA 47* 45*  PROT 8.0 7.6  ALBUMIN 4.5 4.5  AST 18 18  ALT 15 15  ALKPHOS 98 108  BILITOT 0.6 1.0     No results found.  Lab Results  Component Value Date   KPAFRELGTCHN 24.2 (H) 06/04/2023   KPAFRELGTCHN 24.5 (H)  12/02/2022   KPAFRELGTCHN 27.3 (H) 04/14/2022   LAMBDASER 12.3 06/04/2023   LAMBDASER 13.5 12/02/2022   LAMBDASER 13.6 04/14/2022   KAPLAMBRATIO 1.97 (H) 06/04/2023   KAPLAMBRATIO 1.81 (H) 12/02/2022   KAPLAMBRATIO 2.01 (H) 04/14/2022     Monoclonal gammopathy # [[place]] Monoclonal gammoathy-[[place] ]Bone marrow-Bx-MAY? 2019--normal trilineage hematopoiesis; no evidence of monoclonal protein. K/L= 1.99; November 2023-myeloma panel IgG kappa 0.2 g/dL; K/L= 2.0.  Otherwise mild anemia; chronic kidney disease stage III; noted to have NO  hypercalcemia. FEB 2025- 0.2 gm/dl  No evidence of any active multiple myeloma- stable.  # again reviewed the natural history of MGUS-the risk of progression is about 1 %/year.  Low risk the patient.  Have potential progression for the next many years.  I think is reasonable to monitor once a year.pt in agreement.   # Hemoglobin- NOV 2023 11.9; MCV 79- ?; Iron saturation -21; ferritin-61; JULY 2024 Hb -12. Continue monitoring for now not on oral iron. Colonoscopy in JAN 2024-"small polyp" [Dr.Hung, [place]]- stable/  # CKD stage III-stable.    # DISPOSITION: # # Follow up to in 12 months- 2 weeks prior- MM panel; k/l light chain;cbc/cmp;iron studies/ferritin- Dr.B     All questions were answered. The patient knows to call the clinic with any problems, questions or concerns.      Earna Coder, MD 07/02/2023 10:24 AM

## 2023-07-02 NOTE — Progress Notes (Signed)
Pt has some chronic pain in shoulder and back. 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 Appetite is fair. Eats one good meal per day. Eats sweets during the day. Energy is low.

## 2023-10-13 NOTE — Progress Notes (Unsigned)
 Cardiology Office Note:  .   Date:  10/14/2023  ID:  Lauren Spears, DOB [birth year], MRN [id number] PCP:  Tena Feeling, MD  Former Cardiology Providers: None [facility] Providers Cardiologist:  Olinda Bertrand, DO , [facility] (established care 05/26/2021) Electrophysiologist:  None  Click to update primary MD,subspecialty MD or APP then REFRESH:1}    Chief Complaint  Patient presents with   Dizziness   Follow-up   Shortness of Breath    History of Present Illness: .   Lauren Spears is a 78 y.o. African-American female whose past medical history and cardiovascular risk factors includes: monoclonal gammopathy/ CKD, HTN, HLD, diet-controlled type 2 diabetes mellitus, postmenopausal female.   Patient was referred to the practice back in 2023 for evaluation of exertional dyspnea.  She was last seen in the office in February 2023 and now presents for 2-year follow-up visit and also endorses dizziness.  Her precordial pain in the past was predominately noncardiac based on symptoms; however, she was also endorsing dyspnea and multiple cardiovascular risk factors and therefore underwent a thorough cardiovascular workup including but not limited to EKG, echo, stress test.  We focus on also improving her modifiable cardiovascular risk factors.  Since last office visit patient states that she was doing well overall until recently experiencing shortness of breath and dizziness.  She has been experiencing worsening shortness of breath over the past two years, primarily with exertion such as cooking, washing dishes, or showering. She often needs to stop and sit down or lean over a chair. Episodes of sweating and tiredness accompany the shortness of breath.  She experiences chest pain that occurs with shortness of breath and dizziness. The pain is located in the middle of her chest and is sometimes followed by lingering soreness. It does not consistently occur with exertion.  She has a history of  hypertension and was previously taking amlodipine, which she has stopped after speaking to her PCP. She monitors her blood pressure at home, noting it is generally good, but she feels lightheaded and dizzy when her blood pressure drops.  She drinks almost a gallon of water a day and uses a 64-ounce jug to track her intake.  Review of Systems: .   Review of Systems  Constitutional: Positive for malaise/fatigue.  Cardiovascular:  Positive for dyspnea on exertion. Negative for chest pain, claudication, irregular heartbeat, leg swelling, near-syncope, orthopnea, palpitations, paroxysmal nocturnal dyspnea and syncope.  Respiratory:  Positive for shortness of breath.   Hematologic/Lymphatic: Negative for bleeding problem.  Neurological:  Positive for dizziness.    Studies Reviewed:   EKG: EKG Interpretation Date/Time:  Thursday Oct 14 2023 08:17:32 EDT Ventricular Rate:  67 PR Interval:  184 QRS Duration:  120 QT Interval:  448 QTC Calculation: 473 R Axis:   239  Text Interpretation: Normal sinus rhythm Low voltage QRS Right bundle branch block When compared with ECG of 08-Aug-2020 17:49, No significant change was found Confirmed by Olinda Bertrand [phone]) on 10/14/2023 8:26:56 AM  Echocardiogram: 06/25/2021: Left ventricle cavity is normal in size. Moderate septal asymmetric hypertrophy of the left ventricle. Septum measures 1.4 cm. Normal LV systolic function with EF 64%. Normal global wall motion. Normal diastolic filling pattern.  Trileaflet aortic valve with mild aortic valve leaflet thickening. No evidence of aortic valve stenosis or regurgitation. Normal right atrial pressure.    Stress Testing: Exercise Myoview stress test 06/25/2020: Exercise nuclear stress test was performed using Bruce protocol.  1 Day Rest/Stress Protocol. Exercise time 3 minutes 29  seconds on Bruce protocol, achieved 5.22 METS, 92% of APMHR .  Stress ECG non-diagnostic for ischemia due to baseline ST-T changes  due to RBBB. Small size, mild intensity, fixed perfusion defect involving the basal inferior inferolateral segments likely due to soft tissue attenuation artifact. LVEF is preserved (65%), normal wall thickness, no high-grade regional wall motion modalities. Low risk study.  RADIOLOGY: NA    Risk Assessment/Calculations:   NA   Labs:       Latest Ref Rng & Units 06/04/2023   10:42 AM 12/02/2022   11:30 AM 04/14/2022   11:43 AM  CBC  WBC 4.0 - 10.5 K/uL 5.1  5.4  5.8   Hemoglobin 12.0 - 15.0 g/dL 11.9  14.7  82.9   Hematocrit 36.0 - 46.0 % 35.6  36.1  35.3   Platelets 150 - 400 K/uL 220  209  211        Latest Ref Rng & Units 06/04/2023   10:42 AM 12/02/2022   11:30 AM 04/14/2022   11:43 AM  BMP  Glucose 70 - 99 mg/dL 562  97  130   BUN 8 - 23 mg/dL 17  18  23    Creatinine 0.44 - 1.00 mg/dL 8.65  7.84  6.96   Sodium 135 - 145 mmol/L 139  137  139   Potassium 3.5 - 5.1 mmol/L 3.9  3.8  3.9   Chloride 98 - 111 mmol/L 104  104  109   CO2 22 - 32 mmol/L 24  25  24    Calcium 8.9 - 10.3 mg/dL 9.8  9.6  9.7       Latest Ref Rng & Units 06/04/2023   10:42 AM 12/02/2022   11:30 AM 04/14/2022   11:43 AM  CMP  Glucose 70 - 99 mg/dL 295  97  284   BUN 8 - 23 mg/dL 17  18  23    Creatinine 0.44 - 1.00 mg/dL 1.32  4.40  1.02   Sodium 135 - 145 mmol/L 139  137  139   Potassium 3.5 - 5.1 mmol/L 3.9  3.8  3.9   Chloride 98 - 111 mmol/L 104  104  109   CO2 22 - 32 mmol/L 24  25  24    Calcium 8.9 - 10.3 mg/dL 9.8  9.6  9.7   Total Protein 6.5 - 8.1 g/dL 7.6  8.0  7.6   Total Bilirubin 0.0 - 1.2 mg/dL 1.0  0.6  0.5   Alkaline Phos 38 - 126 U/L 108  98  90   AST 15 - 41 U/L 18  18  22    ALT 0 - 44 U/L 15  15  19       No results found for: "CHOL", "HDL", "LDLCALC", "LDLDIRECT", "TRIG", "CHOLHDL" No results for input(s): "LIPOA" in the last 8760 hours. No components found for: "NTPROBNP" No results for input(s): "PROBNP" in the last 8760 hours. No results for input(s): "TSH" in  the last 8760 hours.     Physical Exam:  Today's Vitals   10/14/23 0812  BP: 134/78  Pulse: 65  Weight: 167 lb 11.2 oz (76.1 kg)  Height: 5\' 5"  (1.651 m)   Orthostatic VS for the past 72 hrs (Last 3 readings):  Orthostatic BP Patient Position BP Location Cuff Size Orthostatic Pulse  10/14/23 0821 114/76 Standing Right Arm Normal 84  10/14/23 0819 125/79 Sitting Right Arm Normal 69  10/14/23 0817 144/78 Supine Right Arm Normal 65   Body mass index  is 27.91 kg/m. Wt Readings from Last 3 Encounters:  10/14/23 167 lb 11.2 oz (76.1 kg)  07/02/23 170 lb (77.1 kg)  12/16/22 164 lb (74.4 kg)    Physical Exam  Constitutional: No distress.  hemodynamically stable  Neck: No JVD present.  Cardiovascular: Normal rate, regular rhythm, S1 normal and S2 normal. Exam reveals no gallop, no S3 and no S4.  No murmur heard. Pulmonary/Chest: Effort normal and breath sounds normal. No stridor. She has no wheezes. She has no rales.  Musculoskeletal:        General: No edema.     Cervical back: Neck supple.  Skin: Skin is warm.   Impression & Recommendation(s):  Impression:   ICD-10-CM   1. Precordial pain  R07.2 EKG 12-Lead    MYOCARDIAL PERFUSION IMAGING    ECHOCARDIOGRAM COMPLETE    2. Shortness of breath  R06.02 EKG 12-Lead    MYOCARDIAL PERFUSION IMAGING    ECHOCARDIOGRAM COMPLETE    3. Dizziness  R42     4. Benign hypertension with CKD (chronic kidney disease) stage III (HCC)  I12.9    N18.30     5. Mixed hyperlipidemia  E78.2     6. Diet-controlled diabetes mellitus (HCC)  E11.9        Recommendation(s):  Precordial pain Shortness of breath Similar symptoms when compared to her prior events. Last ischemic workup was in 2022/2023 Her precordial pain predominantly noncardiac based on symptoms.  However given her worsening/progressive dyspnea recommended ruling out obstructive CAD.  We discussed undergoing going coronary CTA but patient is hesitant given her chronic kidney  disease to avoid contrast use.  Based on her last labs from January 2025 her serum creatinine is 1.24 and GFR 45 consistent with CKD 3.  I provided reassurance but patient remains reluctant.  Shared decision was to hold off on coronary CTA and proceed forward with a nuclear stress test.  Since she is a fall risk given her lightheaded and dizziness recommending pharmacological stress.  Dizziness Currently not on any blood pressure medications. Home blood pressure log reviewed. She does have episodes of hypotension when she experiences lightheaded and dizziness. Orthostatic vital signs essentially negative per definition but systolic blood pressures did decrease by 19 mmHg with changing position. Would avoid diuretics or vasodilator therapy for now. Recommended compression stockings. Reemphasized importance of hydration. Recommend that she follows up with PCP to rule out other causes of hypotension.  Benign hypertension with CKD (chronic kidney disease) stage III (HCC) Office blood pressures are acceptable. Currently not on medical therapy due to lightheaded and dizziness. Currently being followed by PCP  Mixed hyperlipidemia Currently on Lipitor 40 mg p.o. nightly. Cardiology following peripherally, managed by primary care provider.   Orders Placed:  Orders Placed This Encounter  Procedures   MYOCARDIAL PERFUSION IMAGING    Standing Status:   Future    Expected Date:   10/21/2023    Expiration Date:   10/13/2024    Patient weight in lbs:   167    Where [facility] this be performed[facility]    Type of stress:   Exercise   EKG 12-Lead   ECHOCARDIOGRAM COMPLETE    Standing Status:   Future    Expected Date:   10/21/2023    Expiration Date:   10/13/2024    Where should this test be performed:   [facility]    Does the patient weigh less than or greater than 250 lbs?:  Patient weighs less than 250 lbs    Perflutren DEFINITY (image enhancing agent) should be  administered unless hypersensitivity or allergy exist:   Administer Perflutren    Reason for exam-Echo:   Other-Full Diagnosis List    Full ICD-10/Reason for Exam:   Shortness of breath [786.05.ICD-9-CM]    Full ICD-10/Reason for Exam:   Precordial pain [786.51.ICD-9-CM]     Final Medication List:   No orders of the defined types were placed in this encounter.   There are no discontinued medications.   Current Outpatient Medications:    ACCU-CHEK GUIDE test strip, AS DIRECTED WHEN CHECKING BLOOD GLUCOSE UP TO ONCE A DAY IN VITRO 90 DAYS, Disp: , Rfl:    atorvastatin (LIPITOR) 40 MG tablet, Take 40 mg by mouth daily., Disp: , Rfl:    Blood Glucose Monitoring Suppl (ACCU-CHEK GUIDE) w/Device KIT, AS DIRECTED WHEN CHECKING BLOOD GLUCOSE UP TO ONCE A DAY (DX: E11.42) 90 DAYS, Disp: , Rfl:    cholecalciferol (VITAMIN D3) 25 MCG (1000 UNIT) tablet, 1,000 Units daily., Disp: , Rfl:    pantoprazole (PROTONIX) 40 MG tablet, Take 40 mg by mouth daily., Disp: , Rfl:    amLODipine (NORVASC) 5 MG tablet, Take 5 mg by mouth daily. (Patient not taking: Reported on 10/14/2023), Disp: , Rfl:   Consent:   NA  Disposition:   1 year follow-up, may ask her to come in sooner based on the results of her echo and nuclear stress test.  Patient agreeable with the plan of care   Her questions and concerns were addressed to her satisfaction. She voices understanding of the recommendations provided during this encounter.    Signed, Awilda Bogus, [facility]  A Division of [facility] [address], [place], [place] [address]  [place], [place] [address] 10/14/2023 10:44 AM

## 2023-10-14 ENCOUNTER — Ambulatory Visit: Attending: Cardiology | Admitting: Cardiology

## 2023-10-14 ENCOUNTER — Encounter: Payer: Self-pay | Admitting: Cardiology

## 2023-10-14 VITALS — BP 134/78 | HR 65 | Ht 65.0 in | Wt 167.7 lb

## 2023-10-14 DIAGNOSIS — R42 Dizziness and giddiness: Secondary | ICD-10-CM

## 2023-10-14 DIAGNOSIS — I129 Hypertensive chronic kidney disease with stage 1 through stage 4 chronic kidney disease, or unspecified chronic kidney disease: Secondary | ICD-10-CM

## 2023-10-14 DIAGNOSIS — R072 Precordial pain: Secondary | ICD-10-CM

## 2023-10-14 DIAGNOSIS — N183 Chronic kidney disease, stage 3 unspecified: Secondary | ICD-10-CM

## 2023-10-14 DIAGNOSIS — R0602 Shortness of breath: Secondary | ICD-10-CM

## 2023-10-14 DIAGNOSIS — E782 Mixed hyperlipidemia: Secondary | ICD-10-CM

## 2023-10-14 DIAGNOSIS — E119 Type 2 diabetes mellitus without complications: Secondary | ICD-10-CM

## 2023-10-14 NOTE — Patient Instructions (Signed)
 Medication Instructions:  No medication changes were made at this visit. Continue current regimen.   *If you need a refill on your cardiac medications before your next appointment, please call your pharmacy*  Lab Work: None ordered today. If you have labs (blood work) drawn today and your tests are completely normal, you will receive your results only by: MyChart Message (if you have MyChart) OR A paper copy in the mail If you have any lab test that is abnormal or we need to change your treatment, we will call you to review the results.  Testing/Procedures: Your physician has requested that you have en exercise stress myoview. For further information please visit [email or web address] Please follow instruction sheet, as given.  Your physician has requested that you have an echocardiogram. Echocardiography is a painless test that uses sound waves to create images of your heart. It provides your doctor with information about the size and shape of your heart and how well your heart's chambers and valves are working. This procedure takes approximately one hour. There are no restrictions for this procedure. Please do NOT wear cologne, perfume, aftershave, or lotions (deodorant is allowed). Please arrive 15 minutes prior to your appointment time.  Please note: We ask at that you not bring children with you during ultrasound (echo/ vascular) testing. Due to room size and safety concerns, children are not allowed in the ultrasound rooms during exams. Our front office staff cannot provide observation of children in our lobby area while testing is being conducted. An adult accompanying a patient to their appointment will only be allowed in the ultrasound room at the discretion of the ultrasound technician under special circumstances. We apologize for any inconvenience.   Follow-Up: At [facility], you and your health needs are our priority.  As part of our continuing mission to provide you with  exceptional heart care, our providers are all part of one team.  This team includes your primary Cardiologist (physician) and Advanced Practice Providers or APPs (Physician Assistants and Nurse Practitioners) who all work together to provide you with the care you need, when you need it.  Your next appointment:   1 year(s)  Provider:   Olinda Bertrand, DO    We recommend signing up for the patient portal called "MyChart".  Sign up information is provided on this After Visit Summary.  MyChart is used to connect with patients for Virtual Visits (Telemedicine).  Patients are able to view lab/test results, encounter notes, upcoming appointments, etc.  Non-urgent messages can be sent to your provider as well.   To learn more about what you can do with MyChart, go to [email or web address].   Other Instructions Exercise Myoview (Stress Test) Instructions  Please arrive 15 minutes prior to your appointment time for registration and insurance purposes.   The test will take approximately 3 to 4 hours to complete; you may bring reading material.  If someone comes with you to your appointment, they will need to remain in the main lobby due to limited space in the testing area. **If you are pregnant or breastfeeding, please notify the nuclear lab prior to your appointment**   How to prepare for your Myocardial Perfusion Test: Do not eat or drink 3 hours prior to your test, except you may have water. Do not consume products containing caffeine (regular or decaffeinated) 12 hours prior to your test. (ex: coffee, chocolate, sodas, tea). Do bring a list of your current medications with you.  If not listed below, you may take your  medications as normal. Do wear comfortable clothes (no dresses or overalls) and walking shoes, tennis shoes preferred (No heels or open toe shoes are allowed). Do NOT wear cologne, perfume, aftershave, or lotions (deodorant is allowed). If these instructions are not followed, your test  will have to be rescheduled.   Please report to [address] [facility], 2nd floor for your test.  If you have questions or concerns about your appointment, you can call the Nuclear Lab at [phone].   If you cannot keep your appointment, please provide 24 hours notification to the Nuclear Lab, to avoid a possible $50 charge to your account.

## 2023-10-19 IMAGING — MG MM DIGITAL SCREENING BILAT W/ TOMO AND CAD
8 series · 8 of 24 positions shown · non-contrast
Comparison: Previous exam(s).

CLINICAL DATA: Screening.

EXAM:
DIGITAL SCREENING BILATERAL MAMMOGRAM WITH TOMOSYNTHESIS AND CAD
TECHNIQUE: Bilateral screening digital craniocaudal and mediolateral oblique
mammograms were obtained. Bilateral screening digital breast
tomosynthesis was performed. The images were evaluated with
computer-aided detection.

[R MLO synth-2D]
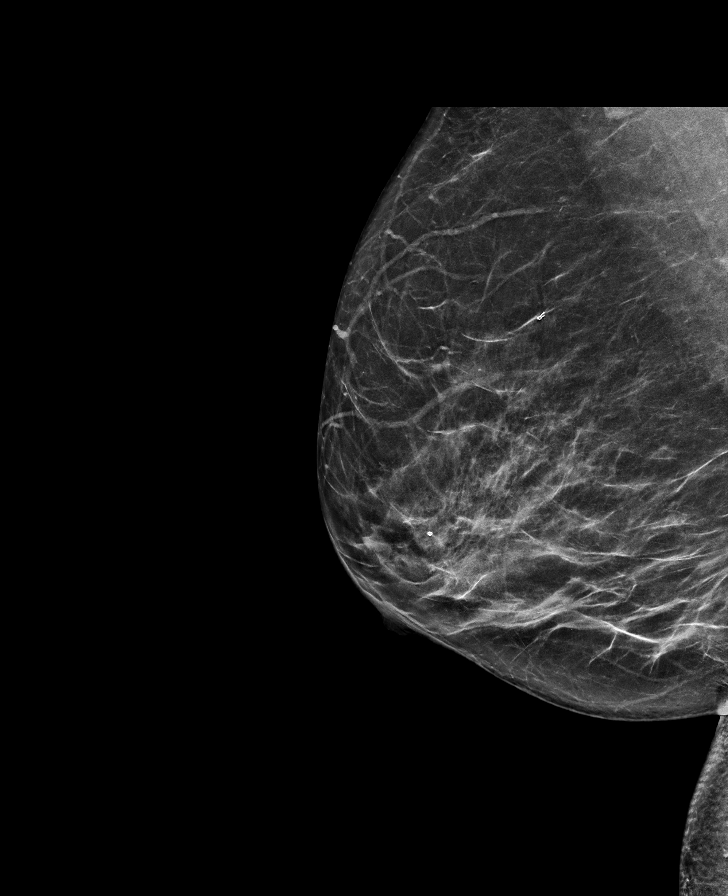

[L MLO synth-2D]
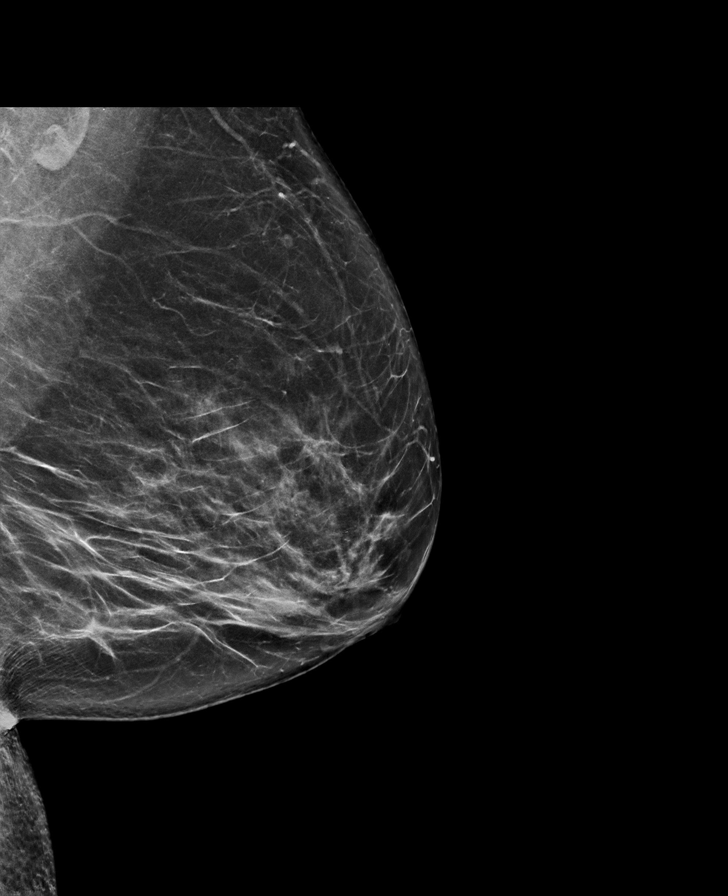

[R CC synth-2D]
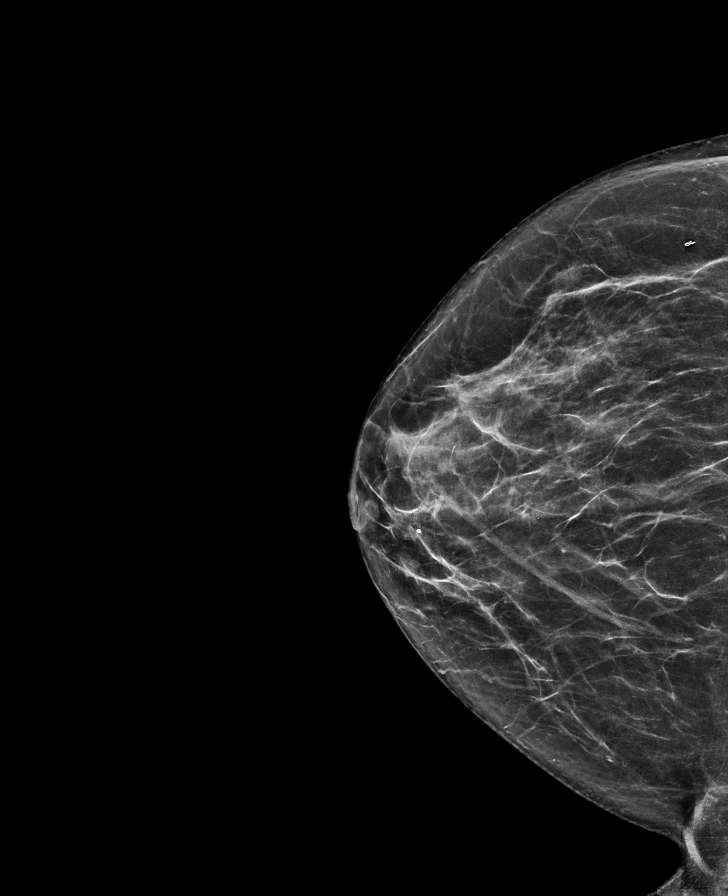

[L CC synth-2D]
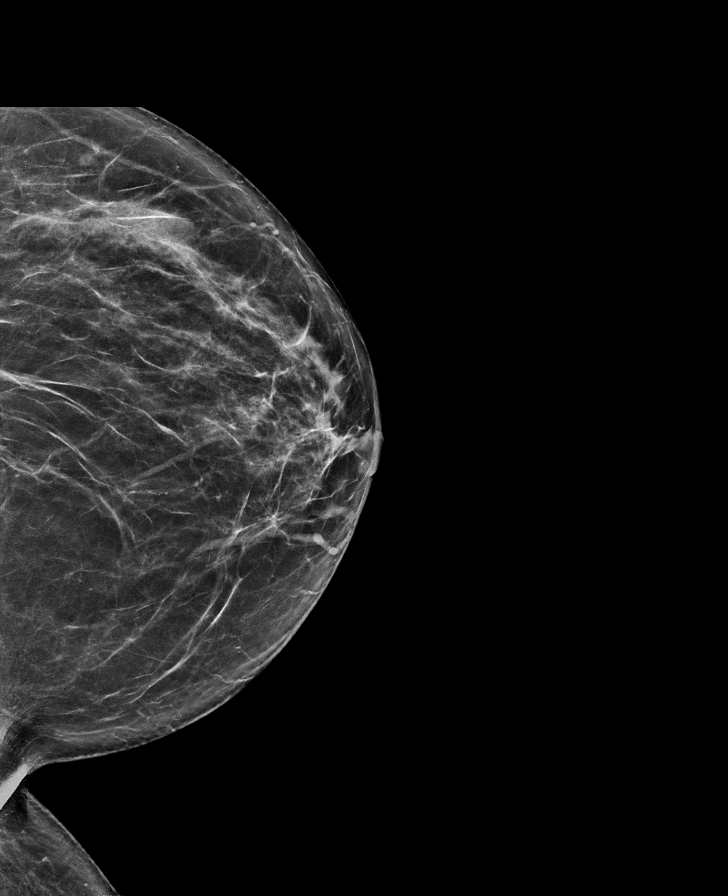

[L CC tomo · tomo slice 37/73.0]
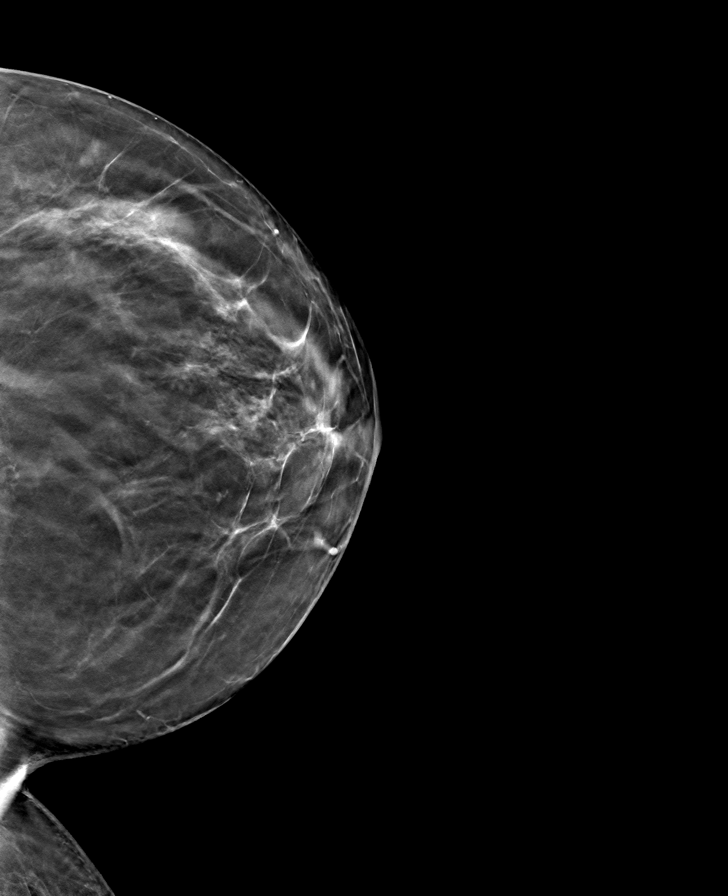

[R CC tomo · tomo slice 35/69.0]
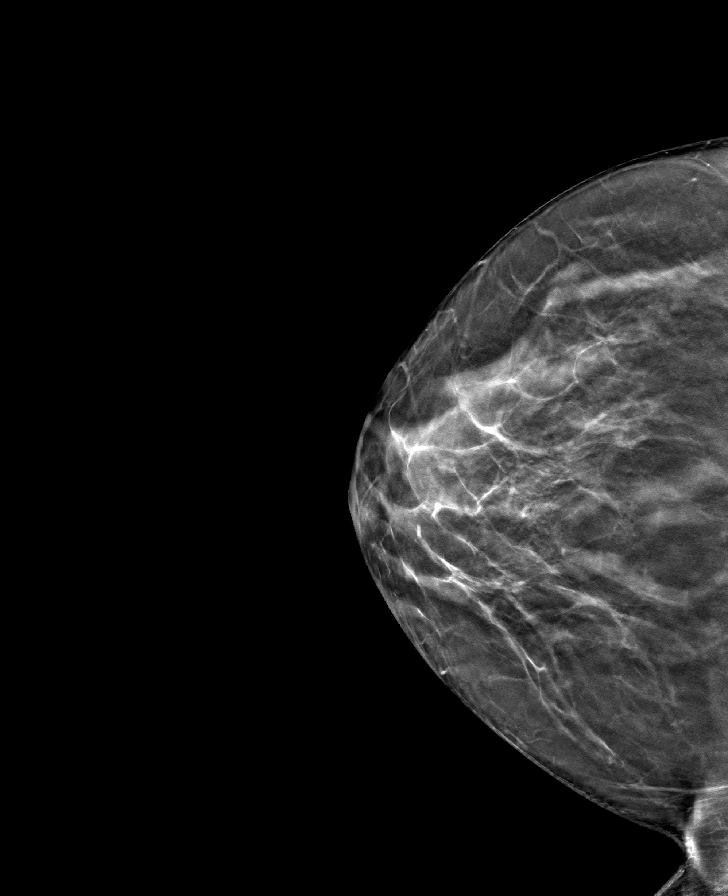

[R MLO tomo · tomo slice 36/71.0]
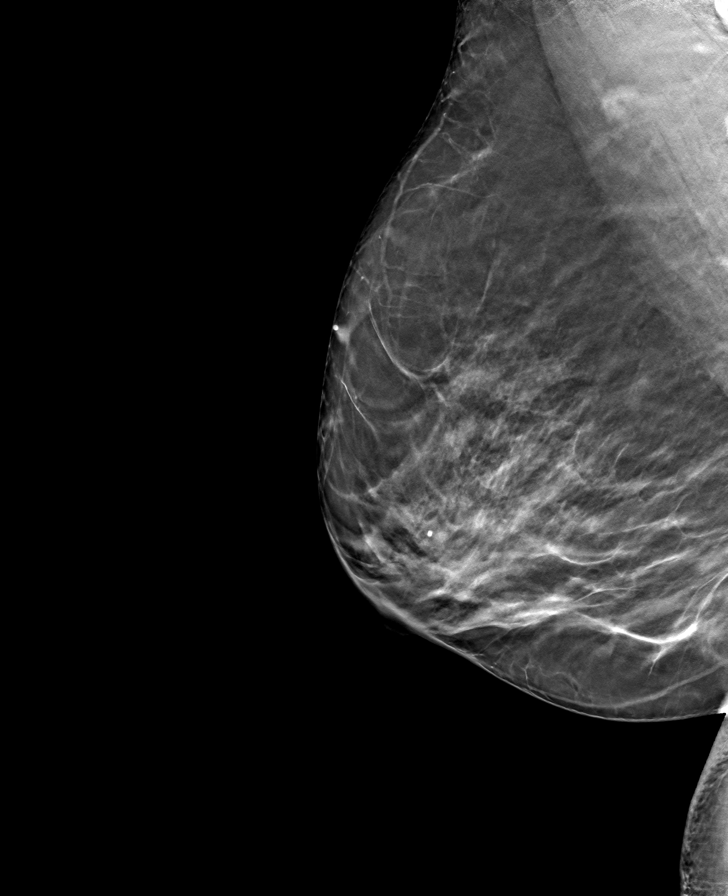

[L MLO tomo · tomo slice 40/79.0]
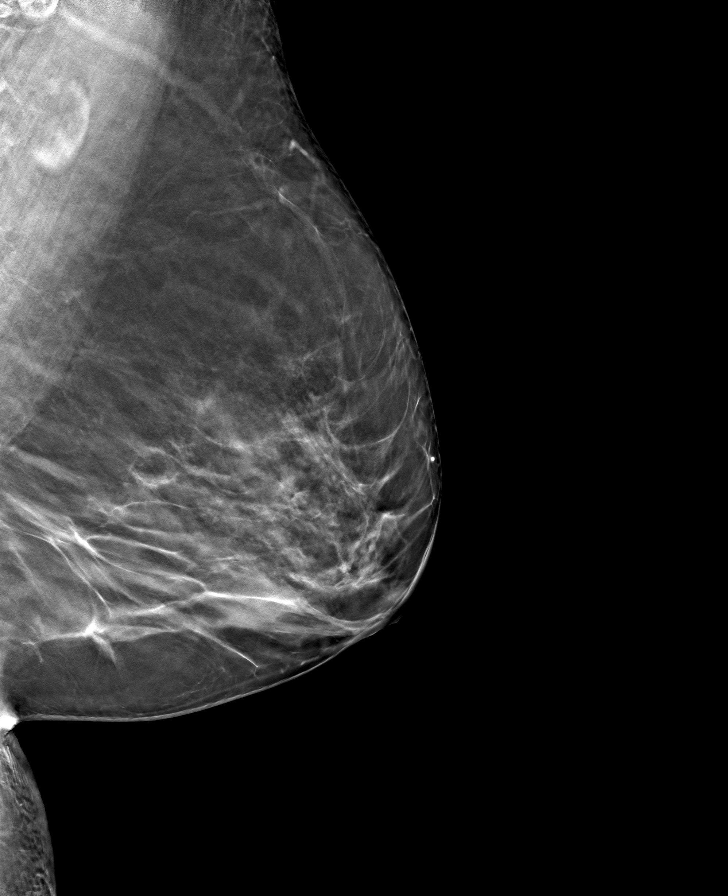

[8 of 24 positions shown; findings below may reference images not displayed]

ACR Breast Density Category b: There are scattered areas of
fibroglandular density.
FINDINGS: There are no findings suspicious for malignancy.
IMPRESSION: No mammographic evidence of malignancy. A result letter of this
screening mammogram will be mailed directly to the patient.

RECOMMENDATION:
Screening mammogram in one year. (Code:51-O-LD2)

BI-RADS CATEGORY  1: Negative.

## 2023-11-09 ENCOUNTER — Other Ambulatory Visit: Payer: Self-pay | Admitting: Cardiology

## 2023-11-09 DIAGNOSIS — R072 Precordial pain: Secondary | ICD-10-CM

## 2023-11-09 DIAGNOSIS — R0602 Shortness of breath: Secondary | ICD-10-CM

## 2023-11-18 ENCOUNTER — Encounter (HOSPITAL_COMMUNITY): Payer: Self-pay | Admitting: *Deleted

## 2023-11-24 ENCOUNTER — Other Ambulatory Visit: Payer: Self-pay

## 2023-11-24 DIAGNOSIS — R072 Precordial pain: Secondary | ICD-10-CM

## 2023-11-24 DIAGNOSIS — R0602 Shortness of breath: Secondary | ICD-10-CM

## 2023-11-29 ENCOUNTER — Encounter (HOSPITAL_COMMUNITY): Payer: Self-pay | Admitting: Cardiology

## 2023-11-29 ENCOUNTER — Ambulatory Visit (HOSPITAL_COMMUNITY): Admission: RE | Admit: 2023-11-29 | Source: Ambulatory Visit | Attending: Cardiology | Admitting: Cardiology

## 2023-11-29 ENCOUNTER — Other Ambulatory Visit (HOSPITAL_COMMUNITY): Payer: Self-pay | Admitting: Cardiology

## 2023-11-29 DIAGNOSIS — R079 Chest pain, unspecified: Secondary | ICD-10-CM

## 2023-12-01 ENCOUNTER — Ambulatory Visit (HOSPITAL_COMMUNITY)
Admission: RE | Admit: 2023-12-01 | Discharge: 2023-12-01 | Disposition: A | Discharge status: Home / Self Care | Source: Ambulatory Visit | Attending: Cardiovascular Disease | Admitting: Cardiovascular Disease

## 2023-12-01 DIAGNOSIS — R072 Precordial pain: Secondary | ICD-10-CM | POA: Insufficient documentation

## 2023-12-01 DIAGNOSIS — R0602 Shortness of breath: Secondary | ICD-10-CM | POA: Insufficient documentation

## 2023-12-01 LAB — MYOCARDIAL PERFUSION IMAGING
Angina Index: 0
Base ST Depression (mm): 0 mm
Duke Treadmill Score: 4
Estimated workload: 4.7
Exercise duration (min): 4 min
Exercise duration (sec): 0 s
LV dias vol: 74 mL (ref 46–106)
LV sys vol: 14 mL (ref 3.8–5.2)
MPHR: 143 {beats}/min
Nuc Stress EF: 81 %
Peak HR: 134 {beats}/min
Percent HR: 93 %
Rest HR: 85 {beats}/min
Rest Nuclear Isotope Dose: 10.1 mCi
SDS: 3
SRS: 0
SSS: 3
ST Depression (mm): 0 mm
Stress Nuclear Isotope Dose: 31.2 mCi
TID: 1.06

## 2023-12-01 MED ORDER — TECHNETIUM TC 99M TETROFOSMIN IV KIT
31.2000 | PACK | Freq: Once | INTRAVENOUS | Status: AC | PRN
  Administered 2023-12-01: 31.2 via INTRAVENOUS

## 2023-12-01 MED ORDER — TECHNETIUM TC 99M TETROFOSMIN IV KIT
10.1000 | PACK | Freq: Once | INTRAVENOUS | Status: AC | PRN
  Administered 2023-12-01: 10.1 via INTRAVENOUS

## 2023-12-12 ENCOUNTER — Ambulatory Visit: Payer: Self-pay | Admitting: Cardiology

## 2024-01-05 ENCOUNTER — Ambulatory Visit (HOSPITAL_COMMUNITY)
Admission: RE | Admit: 2024-01-05 | Discharge: 2024-01-05 | Disposition: A | Discharge status: Home / Self Care | Source: Ambulatory Visit | Attending: Cardiovascular Disease | Admitting: Cardiovascular Disease

## 2024-01-05 DIAGNOSIS — R079 Chest pain, unspecified: Secondary | ICD-10-CM | POA: Insufficient documentation

## 2024-01-05 LAB — ECHOCARDIOGRAM COMPLETE
Area-P 1/2: 4.63 cm2
S' Lateral: 2.5 cm

## 2024-01-06 ENCOUNTER — Ambulatory Visit: Payer: Self-pay | Admitting: Cardiology

## 2024-01-18 NOTE — Telephone Encounter (Signed)
 Patient is returning call.

## 2024-04-04 ENCOUNTER — Other Ambulatory Visit: Payer: Self-pay | Admitting: Internal Medicine

## 2024-04-04 DIAGNOSIS — Z1231 Encounter for screening mammogram for malignant neoplasm of breast: Secondary | ICD-10-CM

## 2024-05-08 ENCOUNTER — Telehealth: Payer: Self-pay | Admitting: Internal Medicine

## 2024-05-08 NOTE — Telephone Encounter (Signed)
 Pt called and needed to r/s her appts. She states she has to have Wednesday appts only. I have r/s appts with pt and confirmed new dates and times. Pt stated she will check with her granddaughter to make sure the new dates work since she is the one who brings her to the appts.

## 2024-05-24 ENCOUNTER — Inpatient Hospital Stay: Attending: Internal Medicine

## 2024-05-24 DIAGNOSIS — D472 Monoclonal gammopathy: Secondary | ICD-10-CM | POA: Insufficient documentation

## 2024-05-24 DIAGNOSIS — R200 Anesthesia of skin: Secondary | ICD-10-CM | POA: Insufficient documentation

## 2024-05-24 DIAGNOSIS — N183 Chronic kidney disease, stage 3 unspecified: Secondary | ICD-10-CM | POA: Diagnosis not present

## 2024-05-24 LAB — CMP (CANCER CENTER ONLY)
ALT: 13 U/L (ref 0–44)
AST: 19 U/L (ref 15–41)
Albumin: 4.4 g/dL (ref 3.5–5.0)
Alkaline Phosphatase: 113 U/L (ref 38–126)
Anion gap: 11 (ref 5–15)
BUN: 18 mg/dL (ref 8–23)
CO2: 23 mmol/L (ref 22–32)
Calcium: 10.2 mg/dL (ref 8.9–10.3)
Chloride: 106 mmol/L (ref 98–111)
Creatinine: 1.45 mg/dL — ABNORMAL HIGH (ref 0.44–1.00)
GFR, Estimated: 37 mL/min — ABNORMAL LOW
Glucose, Bld: 104 mg/dL — ABNORMAL HIGH (ref 70–99)
Potassium: 4.2 mmol/L (ref 3.5–5.1)
Sodium: 140 mmol/L (ref 135–145)
Total Bilirubin: 0.5 mg/dL (ref 0.0–1.2)
Total Protein: 7.4 g/dL (ref 6.5–8.1)

## 2024-05-24 LAB — CBC WITH DIFFERENTIAL (CANCER CENTER ONLY)
Abs Immature Granulocytes: 0.02 K/uL (ref 0.00–0.07)
Basophils Absolute: 0.1 K/uL (ref 0.0–0.1)
Basophils Relative: 1 %
Eosinophils Absolute: 0.1 K/uL (ref 0.0–0.5)
Eosinophils Relative: 2 %
HCT: 33.1 % — ABNORMAL LOW (ref 36.0–46.0)
Hemoglobin: 11.2 g/dL — ABNORMAL LOW (ref 12.0–15.0)
Immature Granulocytes: 0 %
Lymphocytes Relative: 36 %
Lymphs Abs: 2.2 K/uL (ref 0.7–4.0)
MCH: 26.5 pg (ref 26.0–34.0)
MCHC: 33.8 g/dL (ref 30.0–36.0)
MCV: 78.3 fL — ABNORMAL LOW (ref 80.0–100.0)
Monocytes Absolute: 0.4 K/uL (ref 0.1–1.0)
Monocytes Relative: 7 %
Neutro Abs: 3.3 K/uL (ref 1.7–7.7)
Neutrophils Relative %: 54 %
Platelet Count: 184 K/uL (ref 150–400)
RBC: 4.23 MIL/uL (ref 3.87–5.11)
RDW: 15.6 % — ABNORMAL HIGH (ref 11.5–15.5)
WBC Count: 6 K/uL (ref 4.0–10.5)
nRBC: 0 % (ref 0.0–0.2)

## 2024-05-24 LAB — IRON AND TIBC
Iron: 69 ug/dL (ref 28–170)
Saturation Ratios: 21 % (ref 10.4–31.8)
TIBC: 323 ug/dL (ref 250–450)
UIBC: 255 ug/dL

## 2024-05-24 LAB — FERRITIN: Ferritin: 76 ng/mL (ref 11–307)

## 2024-05-25 LAB — KAPPA/LAMBDA LIGHT CHAINS
Kappa free light chain: 27.3 mg/L — ABNORMAL HIGH (ref 3.3–19.4)
Kappa, lambda light chain ratio: 1.77 — ABNORMAL HIGH (ref 0.26–1.65)
Lambda free light chains: 15.4 mg/L (ref 5.7–26.3)

## 2024-05-30 LAB — MULTIPLE MYELOMA PANEL, SERUM
Albumin SerPl Elph-Mcnc: 3.8 g/dL (ref 2.9–4.4)
Albumin/Glob SerPl: 1.3 (ref 0.7–1.7)
Alpha 1: 0.2 g/dL (ref 0.0–0.4)
Alpha2 Glob SerPl Elph-Mcnc: 0.6 g/dL (ref 0.4–1.0)
B-Globulin SerPl Elph-Mcnc: 1.2 g/dL (ref 0.7–1.3)
Gamma Glob SerPl Elph-Mcnc: 1.2 g/dL (ref 0.4–1.8)
Globulin, Total: 3.1 g/dL (ref 2.2–3.9)
IgA: 123 mg/dL (ref 64–422)
IgG (Immunoglobin G), Serum: 1254 mg/dL (ref 586–1602)
IgM (Immunoglobulin M), Srm: 99 mg/dL (ref 26–217)
M Protein SerPl Elph-Mcnc: 0.2 g/dL — ABNORMAL HIGH
Total Protein ELP: 6.9 g/dL (ref 6.0–8.5)

## 2024-05-31 ENCOUNTER — Inpatient Hospital Stay

## 2024-06-07 ENCOUNTER — Encounter: Payer: Self-pay | Admitting: Internal Medicine

## 2024-06-07 ENCOUNTER — Inpatient Hospital Stay: Admitting: Internal Medicine

## 2024-06-07 VITALS — BP 153/78 | HR 67 | Temp 96.4°F | Resp 16 | Ht 65.0 in | Wt 168.6 lb

## 2024-06-07 DIAGNOSIS — D472 Monoclonal gammopathy: Secondary | ICD-10-CM | POA: Diagnosis not present

## 2024-06-07 NOTE — Assessment & Plan Note (Addendum)
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 2019--normal trilineage hematopoiesis; no evidence of monoclonal protein. K/L= 1.99; November 2023-myeloma panel IgG kappa 0.2 g/dL; K/L= 2.0.  Otherwise mild anemia; chronic kidney disease stage III; noted to have NO  hypercalcemia. JAN 2026- 0.2 gm/dl; K/L fairly unremarkable No evidence of any active multiple myeloma- stable.  # again reviewed the natural history of MGUS-the risk of progression is about 1 %/year.  Low risk the patient.  Have potential progression for the next many years.  I think is reasonable to monitor once a year.pt in agreement.   # Hemoglobin- [? Sickle cell trait as per pt] Colonoscopy in JAN 2024-small polyp [Dr.Hung, [place]]- stable; I sat- 21% recommend gentle iron.   # CKD stage III- overall stable [off anti-HTN]; today lower GFR at 37 baseline 45s. Defer to PCP/nephrology follow up-   #  Tingling numbness in extremities-? DM- not on meds now.   # DISPOSITION: # # Follow up to in 12 months- 2 weeks prior- MM panel; k/l light chain;cbc/cmp;iron studies/ferritin- Dr.B

## 2024-06-07 NOTE — Progress Notes (Signed)
 12/01/2023 myocardial perfusion ct, 11/29/23 echocardiogram.

## 2024-06-07 NOTE — Patient Instructions (Signed)
#  Recommend gentle iron  [iron  biglycinate; 28 mg ] 1 pill a day.  This pill is unlikely to cause stomach upset or cause constipation.  Available Over the counter or talk to pharmacist.

## 2024-06-07 NOTE — Progress Notes (Signed)
 [facility] CONSULT NOTE  Patient Care Team: Dwight Trula SQUIBB, MD as PCP - General (Internal Medicine) Michele Richardson, DO as PCP - Cardiology (Cardiology) Rennie Cindy SAUNDERS, MD as Consulting Physician (Oncology)  CHIEF COMPLAINTS/PURPOSE OF CONSULTATION: Monoclonal gammopathy  HEMATOLOGY HISTORY  # MONOCLONAL GAMMOPATHY- MGUS [[place] -2018-2019; as per pt- Bone marrow-Bx-MAY 2021-normal trilineage hematopoiesis; no evidence of monoclonal protein.  # CKD stage- III [PCP]; PN; DM  HISTORY OF PRESENTING ILLNESS: Alone.  Ambulating independently.  Lauren Spears 79 y.o.  female is here for follow-up of monoclonal gammopathy/ CKD is here for follow-up.  Discussed the use of AI scribe software for clinical note transcription with the patient, who gave verbal consent to proceed.  History of Present Illness   Lauren Spears is a 79 year old female with monoclonal gammopathy of undetermined significance (MGUS), chronic kidney disease stage 3, and anemia who presents for routine hematology/oncology follow-up to monitor disease stability and laboratory parameters.  MGUS remains stable, with M protein levels consistently at 0.2 g/dL over the past three years and a stable kappa/lambda light chain ratio. The patient reports that her sister has multiple myeloma.  Her creatinine values have typically been in the 40-45 range and most recently 37. She underwent a contrast-enhanced scan in March of the previous year after a possible stroke but otherwise avoids contrast exposure.  Her hemoglobin is 11.2 g/dL. She has never taken iron supplementation. She reports having sickle cell trait and no history of sickle cell disease.  She has experienced episodes of hypotension, dizziness, and nausea, which led to discontinuation of antihypertensive therapy. She is not currently on antihypertensive medication. She reports seeing her primary care provider for wellness checkups and her next appointment is  scheduled for May.      Review of Systems  Constitutional:  Positive for malaise/fatigue. Negative for chills, diaphoresis, fever and weight loss.  HENT:  Negative for nosebleeds and sore throat.   Eyes:  Negative for double vision.  Respiratory:  Negative for cough, hemoptysis, sputum production, shortness of breath and wheezing.   Cardiovascular:  Negative for chest pain, palpitations, orthopnea and leg swelling.  Gastrointestinal:  Negative for abdominal pain, blood in stool, constipation, diarrhea, heartburn, melena, nausea and vomiting.  Genitourinary:  Negative for dysuria, frequency and urgency.  Musculoskeletal:  Positive for back pain and joint pain.  Skin: Negative.  Negative for itching and rash.  Neurological:  Positive for tingling. Negative for dizziness, focal weakness, weakness and headaches.  Endo/Heme/Allergies:  Does not bruise/bleed easily.  Psychiatric/Behavioral:  Negative for depression. The patient is not nervous/anxious and does not have insomnia.   All other systems reviewed and are negative.   MEDICAL HISTORY:  Past Medical History:  Diagnosis Date   Diabetes mellitus without complication (HCC)    Hyperlipidemia    Hypertension    Renal disorder     SURGICAL HISTORY: Past Surgical History:  Procedure Laterality Date   ABDOMINAL HYSTERECTOMY     BREAST BIOPSY Right 2002   neg    SOCIAL HISTORY: Social History   Socioeconomic History   Marital status: Widowed    Spouse name: Not on file   Number of children: 1   Years of education: Not on file   Highest education level: Not on file  Occupational History   Not on file  Tobacco Use   Smoking status: Never   Smokeless tobacco: Never  Vaping Use   Vaping status: Never Used  Substance and Sexual Activity   Alcohol use:  Never   Drug use: Never   Sexual activity: Not on file  Other Topics Concern   Not on file  Social History Narrative   Moved from [place] in [place]; lives in [place] with daughter.  Never smoked; no alcohol. Used to retd clerk for telephone.    Social Drivers of Health   Tobacco Use: Low Risk (06/07/2024)   Patient History    Smoking Tobacco Use: Never    Smokeless Tobacco Use: Never    Passive Exposure: Not on file  Financial Resource Strain: Not on file  Food Insecurity: Not on file  Transportation Needs: Not on file  Physical Activity: Not on file  Stress: Not on file  Social Connections: Not on file  Intimate Partner Violence: Not on file  Depression (PHQ2-9): Low Risk (06/07/2024)   Depression (PHQ2-9)    PHQ-2 Score: 0  Alcohol Screen: Not on file  Housing: Not on file  Utilities: Not on file  Health Literacy: Not on file    FAMILY HISTORY: Family History  Problem Relation Age of Onset   Leukemia Mother 15   Sickle cell anemia Father 83   Heart disease Sister    Multiple myeloma Sister 43   Heart disease Brother    Prostate cancer Brother 34   Prostate cancer Brother 48    ALLERGIES:  has no known allergies.  MEDICATIONS:  Current Outpatient Medications  Medication Sig Dispense Refill   ACCU-CHEK GUIDE test strip AS DIRECTED WHEN CHECKING BLOOD GLUCOSE UP TO ONCE A DAY IN VITRO 90 DAYS     atorvastatin (LIPITOR) 40 MG tablet Take 40 mg by mouth daily.     Blood Glucose Monitoring Suppl (ACCU-CHEK GUIDE) w/Device KIT AS DIRECTED WHEN CHECKING BLOOD GLUCOSE UP TO ONCE A DAY (DX: E11.42) 90 DAYS     cholecalciferol (VITAMIN D3) 25 MCG (1000 UNIT) tablet 1,000 Units daily.     pantoprazole (PROTONIX) 40 MG tablet Take 40 mg by mouth daily.     No current facility-administered medications for this visit.      PHYSICAL EXAMINATION:   Vitals:   06/07/24 0915 06/07/24 0923  BP: (!) 154/79 (!) 153/78  Pulse: 67   Resp: 16   Temp: (!) 96.4 F (35.8 C)   SpO2: 100%      Filed Weights   06/07/24 0915  Weight: 168 lb 9.6 oz (76.5 kg)      Physical Exam Vitals and nursing note reviewed.  HENT:     Head: Normocephalic and  atraumatic.     Mouth/Throat:     Pharynx: Oropharynx is clear.  Eyes:     Extraocular Movements: Extraocular movements intact.     Pupils: Pupils are equal, round, and reactive to light.  Cardiovascular:     Rate and Rhythm: Normal rate and regular rhythm.  Pulmonary:     Comments: Decreased breath sounds bilaterally.  Abdominal:     Palpations: Abdomen is soft.  Musculoskeletal:        General: Normal range of motion.     Cervical back: Normal range of motion.  Skin:    General: Skin is warm.  Neurological:     General: No focal deficit present.     Mental Status: She is alert and oriented to person, place, and time.  Psychiatric:        Behavior: Behavior normal.        Judgment: Judgment normal.     LABORATORY DATA:  I have reviewed the data as listed  Lab Results  Component Value Date   WBC 6.0 05/24/2024   HGB 11.2 (L) 05/24/2024   HCT 33.1 (L) 05/24/2024   MCV 78.3 (L) 05/24/2024   PLT 184 05/24/2024   Recent Labs    05/24/24 1109  NA 140  K 4.2  CL 106  CO2 23  GLUCOSE 104*  BUN 18  CREATININE 1.45*  CALCIUM 10.2  GFRNONAA 37*  PROT 7.4  ALBUMIN 4.4  AST 19  ALT 13  ALKPHOS 113  BILITOT 0.5     No results found.  Lab Results  Component Value Date   KPAFRELGTCHN 27.3 (H) 05/24/2024   KPAFRELGTCHN 24.2 (H) 06/04/2023   KPAFRELGTCHN 24.5 (H) 12/02/2022   LAMBDASER 15.4 05/24/2024   LAMBDASER 12.3 06/04/2023   LAMBDASER 13.5 12/02/2022   KAPLAMBRATIO 1.77 (H) 05/24/2024   KAPLAMBRATIO 1.97 (H) 06/04/2023   KAPLAMBRATIO 1.81 (H) 12/02/2022     Monoclonal gammopathy # [[place]] [place]oclonal gammoathy-[[place] ]Bone marrow-Bx-MAY? 2019--normal trilineage hematopoiesis; no evidence of monoclonal protein. K/L= 1.99; November 2023-myeloma panel IgG kappa 0.2 g/dL; K/L= 2.0.  Otherwise mild anemia; chronic kidney disease stage III; noted to have NO  hypercalcemia. JAN 2026- 0.2 gm/dl; K/L fairly unremarkable No evidence of any active multiple myeloma-  stable.  # again reviewed the natural history of MGUS-the risk of progression is about 1 %/year.  Low risk the patient.  Have potential progression for the next many years.  I think is reasonable to monitor once a year.pt in agreement.   # Hemoglobin- [? Sickle cell trait as per pt] Colonoscopy in JAN 2024-small polyp [Dr.Hung, [place]]- stable; I sat- 21% recommend gentle iron.   # CKD stage III- overall stable [off anti-HTN]; today lower GFR at 37 baseline 45s. Defer to PCP/nephrology follow up-   #  Tingling numbness in extremities-? DM- not on meds now.   # DISPOSITION: # # Follow up to in 12 months- 2 weeks prior- MM panel; k/l light chain;cbc/cmp;iron studies/ferritin- Dr.B  All questions were answered. The patient knows to call the clinic with any problems, questions or concerns.    Cindy JONELLE Joe, MD 06/07/2024 10:07 AM

## 2024-06-14 ENCOUNTER — Inpatient Hospital Stay: Admitting: Internal Medicine

## 2024-06-16 ENCOUNTER — Other Ambulatory Visit: Payer: Medicare Other

## 2024-06-30 ENCOUNTER — Ambulatory Visit: Payer: Medicare Other | Admitting: Internal Medicine

## 2025-05-24 ENCOUNTER — Inpatient Hospital Stay

## 2025-06-07 ENCOUNTER — Inpatient Hospital Stay: Admitting: Internal Medicine
